# Patient Record
Sex: Male | Born: 1960 | Race: Black or African American | Hispanic: No | Marital: Married | Smoking: Never smoker
Health system: Southern US, Community
[De-identification: ages and names within clinical notes are randomized; demographics above are authoritative.]

## PROBLEM LIST (undated history)

## (undated) DIAGNOSIS — M1990 Unspecified osteoarthritis, unspecified site: Secondary | ICD-10-CM

## (undated) DIAGNOSIS — I10 Essential (primary) hypertension: Secondary | ICD-10-CM

---

## 1997-12-04 ENCOUNTER — Encounter: Payer: Self-pay | Admitting: Pulmonary Disease

## 1997-12-04 ENCOUNTER — Ambulatory Visit (HOSPITAL_COMMUNITY): Admission: RE | Admit: 1997-12-04 | Discharge: 1997-12-04 | Payer: Self-pay | Admitting: Pulmonary Disease

## 1999-03-31 ENCOUNTER — Emergency Department (HOSPITAL_COMMUNITY): Admission: EM | Admit: 1999-03-31 | Discharge: 1999-03-31 | Payer: Self-pay | Admitting: Emergency Medicine

## 2001-01-14 ENCOUNTER — Emergency Department (HOSPITAL_COMMUNITY): Admission: EM | Admit: 2001-01-14 | Discharge: 2001-01-14 | Payer: Self-pay | Admitting: Emergency Medicine

## 2016-05-17 DIAGNOSIS — G039 Meningitis, unspecified: Secondary | ICD-10-CM

## 2016-05-17 DIAGNOSIS — N289 Disorder of kidney and ureter, unspecified: Secondary | ICD-10-CM | POA: Diagnosis not present

## 2016-05-17 DIAGNOSIS — I10 Essential (primary) hypertension: Secondary | ICD-10-CM

## 2016-05-17 DIAGNOSIS — M170 Bilateral primary osteoarthritis of knee: Secondary | ICD-10-CM | POA: Diagnosis not present

## 2016-05-18 DIAGNOSIS — M170 Bilateral primary osteoarthritis of knee: Secondary | ICD-10-CM | POA: Diagnosis not present

## 2016-05-18 DIAGNOSIS — N289 Disorder of kidney and ureter, unspecified: Secondary | ICD-10-CM

## 2016-05-18 DIAGNOSIS — I10 Essential (primary) hypertension: Secondary | ICD-10-CM | POA: Diagnosis not present

## 2016-05-18 DIAGNOSIS — G039 Meningitis, unspecified: Secondary | ICD-10-CM | POA: Diagnosis not present

## 2016-05-19 DIAGNOSIS — M170 Bilateral primary osteoarthritis of knee: Secondary | ICD-10-CM | POA: Diagnosis not present

## 2016-05-19 DIAGNOSIS — I10 Essential (primary) hypertension: Secondary | ICD-10-CM | POA: Diagnosis not present

## 2016-05-19 DIAGNOSIS — N289 Disorder of kidney and ureter, unspecified: Secondary | ICD-10-CM | POA: Diagnosis not present

## 2016-05-19 DIAGNOSIS — G039 Meningitis, unspecified: Secondary | ICD-10-CM | POA: Diagnosis not present

## 2017-08-04 ENCOUNTER — Ambulatory Visit: Payer: BLUE CROSS/BLUE SHIELD | Admitting: Podiatry

## 2017-08-04 ENCOUNTER — Encounter

## 2017-08-04 ENCOUNTER — Ambulatory Visit (INDEPENDENT_AMBULATORY_CARE_PROVIDER_SITE_OTHER): Payer: BLUE CROSS/BLUE SHIELD

## 2017-08-04 VITALS — BP 161/110 | HR 65

## 2017-08-04 DIAGNOSIS — M722 Plantar fascial fibromatosis: Secondary | ICD-10-CM

## 2017-08-04 DIAGNOSIS — M216X9 Other acquired deformities of unspecified foot: Secondary | ICD-10-CM

## 2017-08-04 DIAGNOSIS — M659 Synovitis and tenosynovitis, unspecified: Secondary | ICD-10-CM | POA: Diagnosis not present

## 2017-08-04 DIAGNOSIS — M79671 Pain in right foot: Secondary | ICD-10-CM

## 2017-08-04 DIAGNOSIS — M79672 Pain in left foot: Secondary | ICD-10-CM

## 2017-08-04 MED ORDER — MELOXICAM 15 MG PO TABS: 15.0000 mg | ORAL_TABLET | Freq: Every day | ORAL | 0 refills | Status: DC

## 2017-08-04 NOTE — Progress Notes (Signed)
  Subjective:  Patient ID: Nicholas Gregory, male    DOB: 05/30/1960,  MRN: [id number]  Chief Complaint  Patient presents with  . Foot Pain     history of PF -last injection right x 3 months ago. Knee replacement on right needs revision, also needs total knee on left. Right foot pain is worse than l;eft -worst first thing in the morning. Has tried ice, injections, inserts, the whole nine yards    57 y.o. male presents with the above complaint.  Reports pain to both heels greater on the right.  Was seen in [place] for this issue.  Has received multiple injections.  Never received in the past.  Reports continued pain worse in the a.m.  No past medical history on file.   Current Outpatient Medications:  .  meloxicam (MOBIC) 15 MG tablet, Take 1 tablet (15 mg total) by mouth daily., Disp: 30 tablet, Rfl: 0  Allergies no known allergies Review of Systems: Negative except as noted in the HPI. Denies N/V/F/Ch. Objective:   Vitals:   08/04/17 0923  BP: (!) 161/110  Pulse: 65   General AA&O x3. Normal mood and affect.  Vascular Dorsalis pedis and posterior tibial pulses  present 2+ bilaterally  Capillary refill normal to all digits. Pedal hair growth normal.  Neurologic Epicritic sensation grossly present bilaterally.  Dermatologic No open lesions. Interspaces clear of maceration. Nails well groomed and normal in appearance.  Orthopedic: MMT 5/5 in dorsiflexion, plantarflexion, inversion, and eversion bilaterally. Tender to palpation at the calcaneal tuber bilaterally. No pain with calcaneal squeeze bilaterally. Ankle ROM diminished range of motion right. Silfverskiold Test: positive right.   Radiographs: Taken and reviewed. No acute fractures. No evidence of calcaneal stress fracture.   Assessment & Plan:  Patient was evaluated and treated and all questions answered.  Plantar Fasciitis, right - XR reviewed as above.  - Educated on icing and stretching. Instructions  given.  - Injection delivered to the plantar fascia as below. - Night splint dispensed.  Procedure: Injection Tendon/Ligament Location: Right plantar fascia at the glabrous junction; medial approach. Skin Prep: Alcohol. Injectate: 1 cc 0.5% marcaine plain, 1 cc dexamethasone phosphate, 0.5 cc kenalog 10. Disposition: Patient tolerated procedure well. Injection site dressed with a band-aid.  Return in about 3 weeks (around 08/25/2017) for Plantar fasciitis R.

## 2017-10-19 ENCOUNTER — Other Ambulatory Visit: Payer: Self-pay | Admitting: Podiatry

## 2017-10-19 DIAGNOSIS — M722 Plantar fascial fibromatosis: Secondary | ICD-10-CM

## 2018-05-12 ENCOUNTER — Ambulatory Visit: Payer: BLUE CROSS/BLUE SHIELD | Admitting: Podiatry

## 2018-05-19 ENCOUNTER — Ambulatory Visit: Payer: BLUE CROSS/BLUE SHIELD | Admitting: Podiatry

## 2019-08-13 ENCOUNTER — Ambulatory Visit (INDEPENDENT_AMBULATORY_CARE_PROVIDER_SITE_OTHER): Payer: PRIVATE HEALTH INSURANCE

## 2019-08-13 ENCOUNTER — Ambulatory Visit: Payer: Self-pay

## 2019-08-13 ENCOUNTER — Other Ambulatory Visit: Payer: Self-pay

## 2019-08-13 ENCOUNTER — Encounter: Payer: Self-pay | Admitting: Orthopedic Surgery

## 2019-08-13 ENCOUNTER — Ambulatory Visit: Payer: PRIVATE HEALTH INSURANCE | Admitting: Orthopedic Surgery

## 2019-08-13 DIAGNOSIS — M25561 Pain in right knee: Secondary | ICD-10-CM | POA: Diagnosis not present

## 2019-08-13 DIAGNOSIS — M25562 Pain in left knee: Secondary | ICD-10-CM

## 2019-08-13 DIAGNOSIS — G8929 Other chronic pain: Secondary | ICD-10-CM

## 2019-08-13 DIAGNOSIS — M545 Low back pain, unspecified: Secondary | ICD-10-CM

## 2019-08-13 MED ORDER — PREDNISONE 10 MG PO TABS
10.0000 mg | ORAL_TABLET | Freq: Every day | ORAL | 0 refills | Status: DC
Start: 2019-08-13 — End: 2019-09-12

## 2019-08-13 NOTE — Progress Notes (Signed)
Office Visit Note   Patient: Nicholas Gregory           Date of Birth: [birth year]           MRN: [id number] Visit Date: 08/13/2019              Requested by: Harlow Mares, MD [address] [place],  [place] [address] PCP: Harlow Mares, MD  Chief Complaint  Patient presents with  . Right Knee - Pain  . Left Knee - Pain  . Lower Back - Pain      HPI: Patient is a 59 year old gentleman who presents for second opinion regarding both knees as well as lower back pain.  Patient also reports that a brother had back pain and was subsequently diagnosed with bone cancer.  He states he has dual residency in both [place] and in [place].  He states that his initial right total knee arthroplasty was performed in [place] in 2016 and underwent revision he states he is being recommended that he revise the current revision.  Patient complains of pain fell arthritis of the left knee which prevents him from performing activities of daily living.  Patient also complains of chronic lower back pain with no radicular symptoms.  Patient states that he has had a colonoscopy about 2 years ago and is unsure if he has had his PSA drawn.  Assessment & Plan: Visit Diagnoses:  1. Chronic pain of both knees   2. Low back pain, unspecified back pain laterality, unspecified chronicity, unspecified whether sciatica present     Plan: At follow-up with his primary care physician patient will have his PSA checked.  Prescription called in for prednisone 20 mg with breakfast to help with his back pain.  I have recommended observation and strengthening for the right knee and we will set him up for outpatient physical therapy to help improve the quad strength.  He has significant atrophy.  Recommend proceeding with a total knee arthroplasty on the left and patient states he would like to proceed as soon as possible.  Orders written for prednisone therapy and we will call him to set  up surgery.  Follow-Up Instructions: Return in about 2 weeks (around 08/27/2019).   Ortho Exam  Patient is alert, oriented, no adenopathy, well-dressed, normal affect, normal respiratory effort. Examination patient does have an antalgic gait.  There is no effusion of the right knee.  He has significant quad atrophy.  The knee is straight varus and valgus stress is stable the patella tracks midline.  Examination of the left knee there is crepitation with range of motion there is pain to palpation the patellofemoral joint as well as medial lateral joint lines.  Collaterals and cruciates are stable.  Examination of the back he has a negative straight leg raise bilaterally with no focal motor weakness in either lower extremity.  Imaging: XR KNEE 3 VIEW LEFT  Result Date: 08/13/2019 2 view radiographs of the left knee shows advanced arthritic changes with varus alignment bony spurs in all 3 compartments with subcondylar sclerosis  XR KNEE 3 VIEW RIGHT  Result Date: 08/13/2019 2 view radiographs of the right knee shows a stable revision total knee arthroplasty no lucency around the hardware.  The AP view shows a old stress reaction through the medial femoral condyle which has healed there is also a stress reaction where the stem is abutting the medial aspect of the distal femur.  The bone has remodeled around the stem  no stress fractures no malalignment.  XR Lumbar Spine 2-3 Views  Result Date: 08/13/2019 2 view radiographs of the lumbar spine shows degenerative disc disease with anterior osteophytic bone spurs most proximal in the lumbar spine.  There is joint space narrowing no pars defect no destructive bony changes.  No images are attached to the encounter.  Labs: No results found for: HGBA1C, ESRSEDRATE, CRP, LABURIC, REPTSTATUS, GRAMSTAIN, CULT, LABORGA   No results found for: ALBUMIN, PREALBUMIN, LABURIC  No results found for: MG No results found for: VD25OH  No results found for:  PREALBUMIN No flowsheet data found.   There is no height or weight on file to calculate BMI.  Orders:  Orders Placed This Encounter  Procedures  . XR KNEE 3 VIEW LEFT  . XR KNEE 3 VIEW RIGHT  . XR Lumbar Spine 2-3 Views  . Ambulatory referral to Physical Therapy   Meds ordered this encounter  Medications  . predniSONE (DELTASONE) 10 MG tablet    Sig: Take 1 tablet (10 mg total) by mouth daily with breakfast.    Dispense:  30 tablet    Refill:  0     Procedures: No procedures performed  Clinical Data: No additional findings.  ROS:  All other systems negative, except as noted in the HPI. Review of Systems  Objective: Vital Signs: There were no vitals taken for this visit.  Specialty Comments:  No specialty comments available.  PMFS History: There are no problems to display for this patient.  History reviewed. No pertinent past medical history.  History reviewed. No pertinent family history.  History reviewed. No pertinent surgical history. Social History   Occupational History  . Not on file  Tobacco Use  . Smoking status: Not on file  Substance and Sexual Activity  . Alcohol use: Not on file  . Drug use: Not on file  . Sexual activity: Not on file

## 2019-09-12 ENCOUNTER — Other Ambulatory Visit: Payer: Self-pay | Admitting: Orthopedic Surgery

## 2019-10-06 ENCOUNTER — Other Ambulatory Visit: Payer: Self-pay | Admitting: Orthopedic Surgery

## 2019-10-09 NOTE — Telephone Encounter (Signed)
Please advise, if you would like to refill? Thank you.

## 2019-10-24 ENCOUNTER — Other Ambulatory Visit: Payer: Self-pay | Admitting: Physician Assistant

## 2019-11-07 ENCOUNTER — Other Ambulatory Visit: Payer: Self-pay | Admitting: Physician Assistant

## 2019-12-21 ENCOUNTER — Encounter (HOSPITAL_COMMUNITY): Admission: RE | Payer: Self-pay | Source: Home / Self Care

## 2019-12-21 ENCOUNTER — Inpatient Hospital Stay (HOSPITAL_COMMUNITY)
Admission: RE | Admit: 2019-12-21 | Payer: PRIVATE HEALTH INSURANCE | Source: Home / Self Care | Admitting: Orthopedic Surgery

## 2019-12-21 SURGERY — ARTHROPLASTY, KNEE, TOTAL
Anesthesia: Choice | Site: Knee | Laterality: Left

## 2020-02-21 ENCOUNTER — Other Ambulatory Visit: Payer: Self-pay

## 2020-03-07 ENCOUNTER — Other Ambulatory Visit: Payer: Self-pay | Admitting: Physician Assistant

## 2020-03-13 ENCOUNTER — Other Ambulatory Visit: Payer: Self-pay

## 2020-03-13 ENCOUNTER — Encounter (HOSPITAL_COMMUNITY): Payer: Self-pay

## 2020-03-13 ENCOUNTER — Encounter (HOSPITAL_COMMUNITY)
Admission: RE | Admit: 2020-03-13 | Discharge: 2020-03-13 | Disposition: A | Discharge status: Home / Self Care | Payer: BC Managed Care – PPO | Source: Ambulatory Visit | Attending: Orthopedic Surgery | Admitting: Orthopedic Surgery

## 2020-03-13 DIAGNOSIS — I10 Essential (primary) hypertension: Secondary | ICD-10-CM | POA: Diagnosis not present

## 2020-03-13 DIAGNOSIS — Z01818 Encounter for other preprocedural examination: Secondary | ICD-10-CM | POA: Diagnosis not present

## 2020-03-13 HISTORY — DX: Essential (primary) hypertension: I10

## 2020-03-13 HISTORY — DX: Unspecified osteoarthritis, unspecified site: M19.90

## 2020-03-13 LAB — BASIC METABOLIC PANEL
Anion gap: 9 (ref 5–15)
BUN: 11 mg/dL (ref 6–20)
CO2: 25 mmol/L (ref 22–32)
Calcium: 9.3 mg/dL (ref 8.9–10.3)
Chloride: 105 mmol/L (ref 98–111)
Creatinine, Ser: 1.3 mg/dL — ABNORMAL HIGH (ref 0.61–1.24)
GFR, Estimated: >60 mL/min (ref >60)
Glucose, Bld: 79 mg/dL (ref 70–99)
Potassium: 4 mmol/L (ref 3.5–5.1)
Sodium: 139 mmol/L (ref 135–145)

## 2020-03-13 LAB — CBC
HCT: 45.8 % (ref 39.0–52.0)
Hemoglobin: 15.3 g/dL (ref 13.0–17.0)
MCH: 28 pg (ref 26.0–34.0)
MCHC: 33.4 g/dL (ref 30.0–36.0)
MCV: 83.7 fL (ref 80.0–100.0)
Platelets: 222 10*3/uL (ref 150–400)
RBC: 5.47 MIL/uL (ref 4.22–5.81)
RDW: 13.3 % (ref 11.5–15.5)
WBC: 6.9 10*3/uL (ref 4.0–10.5)
nRBC: 0 % (ref 0.0–0.2)

## 2020-03-13 LAB — SURGICAL PCR SCREEN
MRSA, PCR: NEGATIVE
Staphylococcus aureus: NEGATIVE

## 2020-03-13 NOTE — Progress Notes (Addendum)
[id number] [place], [place] - [address] AT [facility] [address] [place] [address] Phone: [phone] Fax: [phone]      Your procedure is scheduled on Wednesday, December 15th.  Report to [facility] Main Entrance "A" at 6:30 A.M., and check in at the Admitting office.  Call this number if you have problems the morning of surgery:  [phone]  Call [phone] if you have any questions prior to your surgery date Monday-Friday 8am-4pm    Remember:  Do not eat after midnight the night before your surgery  You may drink clear liquids until 5:30 AM the morning of your surgery.   Clear liquids allowed are: Water, Non-Citrus Juices (without pulp), Carbonated Beverages, Clear Tea, Black Coffee Only, and Gatorade  Please finish your pre-surgery Ensure by 5:30 AM, the morning of surgery.  Do not sip.  Drink all in one sitting.  Nothing else to drink after you finish the Ensure.    Take these medicines the morning of surgery with A SIP OF WATER   Amlodipine (Norvasc)  As of today, STOP taking any Aspirin (unless otherwise instructed by your surgeon) Aleve, Naproxen, Ibuprofen, Motrin, Advil, Goody's, BC's, all herbal medications, fish oil, and all vitamins.                      Do not wear jewelry            Do not wear lotions, powders, colognes, or deodorant.            Men may shave face and neck.            Do not bring valuables to the hospital.            [facility] is not responsible for any belongings or valuables.  Do NOT Smoke (Tobacco/Vaping) or drink Alcohol 24 hours prior to your procedure If you use a CPAP at night, you may bring all equipment for your overnight stay.   Contacts, glasses, dentures or bridgework may not be worn into surgery.      For patients admitted to the hospital, discharge time will be determined by your treatment team.   Patients discharged the day of surgery will not be allowed to drive home, and  someone needs to stay with them for 24 hours.    Special instructions:   - Preparing For Surgery  Before surgery, you can play an important role. Because skin is not sterile, your skin needs to be as free of germs as possible. You can reduce the number of germs on your skin by washing with CHG (chlorahexidine gluconate) Soap before surgery.  CHG is an antiseptic cleaner which kills germs and bonds with the skin to continue killing germs even after washing.    Oral Hygiene is also important to reduce your risk of infection.  Remember - BRUSH YOUR TEETH THE MORNING OF SURGERY WITH YOUR REGULAR TOOTHPASTE  Please do not use if you have an allergy to CHG or antibacterial soaps. If your skin becomes reddened/irritated stop using the CHG.  Do not shave (including legs and underarms) for at least 48 hours prior to first CHG shower. It is OK to shave your face.  Please follow these instructions carefully.   1. Shower the NIGHT BEFORE SURGERY and the MORNING OF SURGERY with CHG Soap.   2. If you chose to wash your hair, wash your hair first as usual with your normal shampoo.  3. After you shampoo, rinse your hair and body thoroughly to remove the shampoo.  4. Use CHG as you would any other liquid soap. You can apply CHG directly to the skin and wash gently with a scrungie or a clean washcloth.   5. Apply the CHG Soap to your body ONLY FROM THE NECK DOWN.  Do not use on open wounds or open sores. Avoid contact with your eyes, ears, mouth and genitals (private parts). Wash Face and genitals (private parts)  with your normal soap.   6. Wash thoroughly, paying special attention to the area where your surgery will be performed.  7. Thoroughly rinse your body with warm water from the neck down.  8. DO NOT shower/wash with your normal soap after using and rinsing off the CHG Soap.  9. Pat yourself dry with a CLEAN TOWEL.  10. Wear CLEAN PAJAMAS to bed the night before  surgery  11. Place CLEAN SHEETS on your bed the night of your first shower and DO NOT SLEEP WITH PETS.   Day of Surgery: Wear Clean/Comfortable clothing the morning of surgery Do not apply any deodorants/lotions.   Remember to brush your teeth WITH YOUR REGULAR TOOTHPASTE.   Please read over the following fact sheets that you were given.

## 2020-03-13 NOTE — Progress Notes (Signed)
PCP - Linden Dolin Cardiologist - denies  Chest x-ray - n/a EKG - 03-13-20   ERAS Protcol - yes, Ensure given   COVID TEST- 03-17-20   Anesthesia review: n/a  Patient denies shortness of breath, fever, cough and chest pain at PAT appointment   All instructions explained to the patient, with a verbal understanding of the material. Patient agrees to go over the instructions while at home for a better understanding. Patient also instructed to self quarantine after being tested for COVID-19. The opportunity to ask questions was provided.   Patient stated he had questions for Dr. Lajoyce Corners.  He stated he tried multiple times to contact Dr. Audrie Lia office, left multiple voicemails, with no return call back.  Called Consuello Bossier at PAT appointment, left message, to have someone from his office call Nicholas Gregory.

## 2020-03-17 ENCOUNTER — Encounter: Payer: Self-pay | Admitting: Orthopedic Surgery

## 2020-03-17 ENCOUNTER — Other Ambulatory Visit (HOSPITAL_COMMUNITY)
Admission: RE | Admit: 2020-03-17 | Discharge: 2020-03-17 | Disposition: A | Discharge status: Home / Self Care | Payer: BC Managed Care – PPO | Source: Ambulatory Visit | Attending: Orthopedic Surgery | Admitting: Orthopedic Surgery

## 2020-03-17 ENCOUNTER — Ambulatory Visit (INDEPENDENT_AMBULATORY_CARE_PROVIDER_SITE_OTHER): Payer: BC Managed Care – PPO | Admitting: Orthopedic Surgery

## 2020-03-17 VITALS — Ht 70.0 in | Wt 241.0 lb

## 2020-03-17 DIAGNOSIS — M25562 Pain in left knee: Secondary | ICD-10-CM | POA: Diagnosis not present

## 2020-03-17 DIAGNOSIS — Z20822 Contact with and (suspected) exposure to covid-19: Secondary | ICD-10-CM | POA: Diagnosis not present

## 2020-03-17 DIAGNOSIS — M1712 Unilateral primary osteoarthritis, left knee: Secondary | ICD-10-CM | POA: Diagnosis not present

## 2020-03-17 DIAGNOSIS — M25561 Pain in right knee: Secondary | ICD-10-CM

## 2020-03-17 DIAGNOSIS — Z01812 Encounter for preprocedural laboratory examination: Secondary | ICD-10-CM | POA: Diagnosis present

## 2020-03-17 DIAGNOSIS — G8929 Other chronic pain: Secondary | ICD-10-CM

## 2020-03-18 ENCOUNTER — Encounter: Payer: Self-pay | Admitting: Orthopedic Surgery

## 2020-03-18 LAB — SARS CORONAVIRUS 2 (TAT 6-24 HRS): SARS Coronavirus 2: NEGATIVE

## 2020-03-18 NOTE — Progress Notes (Signed)
Office Visit Note   Patient: Nicholas Gregory           Date of Birth: January 09, 1961           MRN: [id number] Visit Date: 03/17/2020              Requested by: Harlow Mares, MD [address] [place],  [place] [address] PCP: Harlow Mares, MD  Chief Complaint  Patient presents with  . Left Knee - Follow-up    Sch for a total knee replacement 03/19/20      HPI: Patient is a 59 year old gentleman who is seen for evaluation for osteoarthritis of his left knee.  He is status post total knee arthroplasty on the right as well as revision surgery.  Patient states he has pain in the right total knee and is concerned about outcome for his left knee.  Assessment & Plan: Visit Diagnoses:  1. Chronic pain of both knees   2. Unilateral primary osteoarthritis, left knee     Plan: Reviewed the importance of postoperative protocol for strengthening patient was given 3 exercises to do at this time to help with the right knee discussed the importance of doing this as a long-term treatment for both total knees.  Risks and benefits of surgery were discussed for total knee arthroplasty on the left patient states he understands wished to proceed with surgery.  Follow-Up Instructions: Return in about 2 weeks (around 03/31/2020).   Ortho Exam  Patient is alert, oriented, no adenopathy, well-dressed, normal affect, normal respiratory effort. Examination radiographs of the right knee shows the femoral stem abutting the cortex of the medial femur.  There has been bony remodeling around this area.  The total knee is stable but patient does have laxity with passive range of motion of the right knee.  Left knee shows bone-on-bone contact the medial joint line with periarticular bony spurs in all 3 compartments.  There is varus alignment.  Imaging: No results found. No images are attached to the encounter.  Labs: No results found for: HGBA1C, ESRSEDRATE, CRP, LABURIC, REPTSTATUS,  GRAMSTAIN, CULT, LABORGA   No results found for: ALBUMIN, PREALBUMIN, LABURIC  No results found for: MG No results found for: VD25OH  No results found for: PREALBUMIN CBC EXTENDED Latest Ref Rng & Units 03/13/2020  WBC 4.0 - 10.5 K/uL 6.9  RBC 4.22 - 5.81 MIL/uL 5.47  HGB 13.0 - 17.0 g/dL 57.8  HCT 46.9 - 62.9 % 45.8  PLT 150 - 400 K/uL 222     Body mass index is 34.58 kg/m.  Orders:  No orders of the defined types were placed in this encounter.  No orders of the defined types were placed in this encounter.    Procedures: No procedures performed  Clinical Data: No additional findings.  ROS:  All other systems negative, except as noted in the HPI. Review of Systems  Objective: Vital Signs: Ht 5\' 10"  (1.778 m)   Wt 241 lb (109.3 kg)   BMI 34.58 kg/m   Specialty Comments:  No specialty comments available.  PMFS History: There are no problems to display for this patient.  Past Medical History:  Diagnosis Date  . Arthritis   . Hypertension     History reviewed. No pertinent family history.  Past Surgical History:  Procedure Laterality Date  . KNEE ARTHROPLASTY Right    x2  . KNEE ARTHROSCOPY Left    Social History   Occupational History  . Not on file  Tobacco  Use  . Smoking status: Never Smoker  . Smokeless tobacco: Never Used  Vaping Use  . Vaping Use: Never used  Substance and Sexual Activity  . Alcohol use: Never  . Drug use: Never  . Sexual activity: Not on file

## 2020-03-18 NOTE — Anesthesia Preprocedure Evaluation (Addendum)
Anesthesia Evaluation  Patient identified by MRN, date of birth, ID band Patient awake    Reviewed: Allergy & Precautions, H&P , NPO status , Patient's Chart, lab work & pertinent test results  Airway Mallampati: II  TM Distance: >3 FB Neck ROM: Full    Dental no notable dental hx. (+) Teeth Intact, Dental Advisory Given   Pulmonary neg pulmonary ROS,    Pulmonary exam normal breath sounds clear to auscultation       Cardiovascular Exercise Tolerance: Good hypertension, Pt. on medications  Rhythm:Regular Rate:Normal     Neuro/Psych negative neurological ROS  negative psych ROS   GI/Hepatic negative GI ROS, Neg liver ROS,   Endo/Other  negative endocrine ROS  Renal/GU negative Renal ROS  negative genitourinary   Musculoskeletal  (+) Arthritis , Osteoarthritis,    Abdominal   Peds  Hematology negative hematology ROS (+)   Anesthesia Other Findings   Reproductive/Obstetrics negative OB ROS                            Anesthesia Physical Anesthesia Plan  ASA: II  Anesthesia Plan: Spinal and MAC   Post-op Pain Management:  Regional for Post-op pain   Induction: Intravenous  PONV Risk Score and Plan: 2 and Ondansetron, Dexamethasone, Propofol infusion and Midazolam  Airway Management Planned: Simple Face Mask  Additional Equipment:   Intra-op Plan:   Post-operative Plan:   Informed Consent: I have reviewed the patients History and Physical, chart, labs and discussed the procedure including the risks, benefits and alternatives for the proposed anesthesia with the patient or authorized representative who has indicated his/her understanding and acceptance.     Dental advisory given  Plan Discussed with: CRNA  Anesthesia Plan Comments:        Anesthesia Quick Evaluation

## 2020-03-19 ENCOUNTER — Encounter (HOSPITAL_COMMUNITY)
Admission: RE | Disposition: A | Discharge status: Home / Self Care | Payer: Self-pay | Source: Home / Self Care | Attending: Orthopedic Surgery

## 2020-03-19 ENCOUNTER — Other Ambulatory Visit: Payer: Self-pay

## 2020-03-19 ENCOUNTER — Ambulatory Visit (HOSPITAL_COMMUNITY): Payer: BC Managed Care – PPO | Admitting: Anesthesiology

## 2020-03-19 ENCOUNTER — Observation Stay (HOSPITAL_COMMUNITY)
Admission: RE | Admit: 2020-03-19 | Discharge: 2020-03-21 | Disposition: A | Discharge status: Home / Self Care | Payer: BC Managed Care – PPO | Attending: Orthopedic Surgery | Admitting: Orthopedic Surgery

## 2020-03-19 ENCOUNTER — Encounter (HOSPITAL_COMMUNITY): Payer: Self-pay | Admitting: Orthopedic Surgery

## 2020-03-19 DIAGNOSIS — M1712 Unilateral primary osteoarthritis, left knee: Principal | ICD-10-CM

## 2020-03-19 DIAGNOSIS — Z23 Encounter for immunization: Secondary | ICD-10-CM | POA: Diagnosis not present

## 2020-03-19 DIAGNOSIS — I10 Essential (primary) hypertension: Secondary | ICD-10-CM | POA: Insufficient documentation

## 2020-03-19 DIAGNOSIS — Z79899 Other long term (current) drug therapy: Secondary | ICD-10-CM | POA: Insufficient documentation

## 2020-03-19 HISTORY — PX: TOTAL KNEE ARTHROPLASTY: SHX125

## 2020-03-19 SURGERY — ARTHROPLASTY, KNEE, TOTAL
Anesthesia: Monitor Anesthesia Care | Site: Knee | Laterality: Left

## 2020-03-19 MED ORDER — KETOROLAC TROMETHAMINE 15 MG/ML IJ SOLN
INTRAMUSCULAR | Status: AC
  Administered 2020-03-19: 12:00:00 15 mg via INTRAVENOUS
  Filled 2020-03-19: qty 1

## 2020-03-19 MED ORDER — CEFAZOLIN SODIUM-DEXTROSE 2-4 GM/100ML-% IV SOLN
INTRAVENOUS | Status: AC
  Filled 2020-03-19: qty 100

## 2020-03-19 MED ORDER — HYDROMORPHONE HCL 1 MG/ML IJ SOLN
0.2500 mg | INTRAMUSCULAR | Status: DC | PRN
  Administered 2020-03-19 (×3): 0.25 mg via INTRAVENOUS

## 2020-03-19 MED ORDER — AMLODIPINE BESYLATE 10 MG PO TABS
10.0000 mg | ORAL_TABLET | Freq: Every day | ORAL | Status: DC
  Administered 2020-03-19 – 2020-03-21 (×3): 10 mg via ORAL
  Filled 2020-03-19 (×3): qty 1

## 2020-03-19 MED ORDER — METHOCARBAMOL 500 MG PO TABS
ORAL_TABLET | ORAL | Status: AC
  Administered 2020-03-19: 12:00:00 500 mg via ORAL
  Filled 2020-03-19: qty 1

## 2020-03-19 MED ORDER — ACETAMINOPHEN 325 MG PO TABS
325.0000 mg | ORAL_TABLET | Freq: Four times a day (QID) | ORAL | Status: DC | PRN
  Administered 2020-03-20 – 2020-03-21 (×2): 650 mg via ORAL
  Filled 2020-03-19 (×2): qty 2

## 2020-03-19 MED ORDER — BUPIVACAINE IN DEXTROSE 0.75-8.25 % IT SOLN
INTRATHECAL | Status: DC | PRN
Start: 2020-03-19 — End: 2020-03-19
  Administered 2020-03-19: 1.8 mL via INTRATHECAL

## 2020-03-19 MED ORDER — MENTHOL 3 MG MT LOZG: 1.0000 | LOZENGE | OROMUCOSAL | Status: DC | PRN

## 2020-03-19 MED ORDER — ACETAMINOPHEN 500 MG PO TABS: 1000.0000 mg | ORAL_TABLET | Freq: Once | ORAL | Status: AC

## 2020-03-19 MED ORDER — ORAL CARE MOUTH RINSE: 15.0000 mL | Freq: Once | OROMUCOSAL | Status: AC

## 2020-03-19 MED ORDER — PROPOFOL 10 MG/ML IV BOLUS
INTRAVENOUS | Status: AC
  Filled 2020-03-19: qty 20

## 2020-03-19 MED ORDER — ASPIRIN EC 325 MG PO TBEC
325.0000 mg | DELAYED_RELEASE_TABLET | Freq: Every day | ORAL | Status: DC
  Administered 2020-03-20 – 2020-03-21 (×2): 325 mg via ORAL
  Filled 2020-03-19 (×2): qty 1

## 2020-03-19 MED ORDER — METOCLOPRAMIDE HCL 5 MG/ML IJ SOLN: 5.0000 mg | Freq: Three times a day (TID) | INTRAMUSCULAR | Status: DC | PRN

## 2020-03-19 MED ORDER — MIDAZOLAM HCL 5 MG/5ML IJ SOLN
INTRAMUSCULAR | Status: DC | PRN
  Administered 2020-03-19: 2 mg via INTRAVENOUS

## 2020-03-19 MED ORDER — SODIUM CHLORIDE 0.9 % IV SOLN: INTRAVENOUS | Status: DC

## 2020-03-19 MED ORDER — FENTANYL CITRATE (PF) 100 MCG/2ML IJ SOLN
INTRAMUSCULAR | Status: DC | PRN
  Administered 2020-03-19: 50 ug via INTRAVENOUS

## 2020-03-19 MED ORDER — PROPOFOL 1000 MG/100ML IV EMUL
INTRAVENOUS | Status: AC
  Filled 2020-03-19: qty 100

## 2020-03-19 MED ORDER — CHLORHEXIDINE GLUCONATE 0.12 % MT SOLN: 15.0000 mL | Freq: Once | OROMUCOSAL | Status: AC

## 2020-03-19 MED ORDER — LACTATED RINGERS IV SOLN: INTRAVENOUS | Status: DC

## 2020-03-19 MED ORDER — METOCLOPRAMIDE HCL 5 MG PO TABS: 5.0000 mg | ORAL_TABLET | Freq: Three times a day (TID) | ORAL | Status: DC | PRN

## 2020-03-19 MED ORDER — CHLORHEXIDINE GLUCONATE 0.12 % MT SOLN
OROMUCOSAL | Status: AC
  Administered 2020-03-19: 07:00:00 15 mL via OROMUCOSAL
  Filled 2020-03-19: qty 15

## 2020-03-19 MED ORDER — MIDAZOLAM HCL 2 MG/2ML IJ SOLN
INTRAMUSCULAR | Status: AC
  Filled 2020-03-19: qty 2

## 2020-03-19 MED ORDER — FENTANYL CITRATE (PF) 250 MCG/5ML IJ SOLN
INTRAMUSCULAR | Status: AC
  Filled 2020-03-19: qty 5

## 2020-03-19 MED ORDER — SODIUM CHLORIDE 0.9 % IR SOLN
Status: DC | PRN
  Administered 2020-03-19: 3000 mL

## 2020-03-19 MED ORDER — METHOCARBAMOL 500 MG PO TABS
500.0000 mg | ORAL_TABLET | Freq: Four times a day (QID) | ORAL | Status: DC | PRN
  Administered 2020-03-20: 12:00:00 500 mg via ORAL
  Filled 2020-03-19: qty 1

## 2020-03-19 MED ORDER — CEFAZOLIN SODIUM-DEXTROSE 2-4 GM/100ML-% IV SOLN
2.0000 g | INTRAVENOUS | Status: AC
  Administered 2020-03-19: 08:00:00 2 g via INTRAVENOUS

## 2020-03-19 MED ORDER — EPHEDRINE SULFATE-NACL 50-0.9 MG/10ML-% IV SOSY
PREFILLED_SYRINGE | INTRAVENOUS | Status: DC | PRN
  Administered 2020-03-19: 5 mg via INTRAVENOUS

## 2020-03-19 MED ORDER — PROPOFOL 500 MG/50ML IV EMUL
INTRAVENOUS | Status: DC | PRN
  Administered 2020-03-19: 75 ug/kg/min via INTRAVENOUS

## 2020-03-19 MED ORDER — TRANEXAMIC ACID-NACL 1000-0.7 MG/100ML-% IV SOLN
INTRAVENOUS | Status: AC
  Filled 2020-03-19: qty 100

## 2020-03-19 MED ORDER — 0.9 % SODIUM CHLORIDE (POUR BTL) OPTIME
TOPICAL | Status: DC | PRN
  Administered 2020-03-19: 09:00:00 1000 mL

## 2020-03-19 MED ORDER — ONDANSETRON HCL 4 MG PO TABS: 4.0000 mg | ORAL_TABLET | Freq: Four times a day (QID) | ORAL | Status: DC | PRN

## 2020-03-19 MED ORDER — TRANEXAMIC ACID 1000 MG/10ML IV SOLN
2000.0000 mg | INTRAVENOUS | Status: AC
  Administered 2020-03-19: 09:00:00 2000 mg via TOPICAL
  Filled 2020-03-19: qty 20

## 2020-03-19 MED ORDER — KETOROLAC TROMETHAMINE 15 MG/ML IJ SOLN
15.0000 mg | Freq: Four times a day (QID) | INTRAMUSCULAR | Status: AC
  Administered 2020-03-19 – 2020-03-20 (×3): 15 mg via INTRAVENOUS
  Filled 2020-03-19 (×3): qty 1

## 2020-03-19 MED ORDER — METHOCARBAMOL 1000 MG/10ML IJ SOLN
500.0000 mg | Freq: Four times a day (QID) | INTRAVENOUS | Status: DC | PRN
  Filled 2020-03-19: qty 5

## 2020-03-19 MED ORDER — PHENYLEPHRINE 40 MCG/ML (10ML) SYRINGE FOR IV PUSH (FOR BLOOD PRESSURE SUPPORT)
PREFILLED_SYRINGE | INTRAVENOUS | Status: DC | PRN
  Administered 2020-03-19: 80 ug via INTRAVENOUS

## 2020-03-19 MED ORDER — TRANEXAMIC ACID-NACL 1000-0.7 MG/100ML-% IV SOLN
INTRAVENOUS | Status: DC | PRN
  Administered 2020-03-19: 1000 mg via INTRAVENOUS

## 2020-03-19 MED ORDER — OXYCODONE HCL 5 MG PO TABS
5.0000 mg | ORAL_TABLET | ORAL | Status: DC | PRN
  Administered 2020-03-19 – 2020-03-21 (×6): 10 mg via ORAL
  Administered 2020-03-21: 5 mg via ORAL
  Filled 2020-03-19: qty 2
  Filled 2020-03-19: qty 1
  Filled 2020-03-19 (×5): qty 2

## 2020-03-19 MED ORDER — ONDANSETRON HCL 4 MG/2ML IJ SOLN: 4.0000 mg | Freq: Four times a day (QID) | INTRAMUSCULAR | Status: DC | PRN

## 2020-03-19 MED ORDER — PHENOL 1.4 % MT LIQD: 1.0000 | OROMUCOSAL | Status: DC | PRN

## 2020-03-19 MED ORDER — ONDANSETRON HCL 4 MG/2ML IJ SOLN
INTRAMUSCULAR | Status: AC
  Administered 2020-03-19: 12:00:00 4 mg via INTRAVENOUS
  Filled 2020-03-19: qty 2

## 2020-03-19 MED ORDER — DOCUSATE SODIUM 100 MG PO CAPS
100.0000 mg | ORAL_CAPSULE | Freq: Two times a day (BID) | ORAL | Status: DC
  Administered 2020-03-19 – 2020-03-21 (×4): 100 mg via ORAL
  Filled 2020-03-19 (×4): qty 1

## 2020-03-19 MED ORDER — HYDROMORPHONE HCL 1 MG/ML IJ SOLN
0.5000 mg | INTRAMUSCULAR | Status: DC | PRN
  Administered 2020-03-20 (×2): 0.5 mg via INTRAVENOUS
  Filled 2020-03-19 (×2): qty 0.5

## 2020-03-19 MED ORDER — ACETAMINOPHEN 500 MG PO TABS
ORAL_TABLET | ORAL | Status: AC
  Administered 2020-03-19: 07:00:00 1000 mg via ORAL
  Filled 2020-03-19: qty 2

## 2020-03-19 MED ORDER — INFLUENZA VAC SPLIT QUAD 0.5 ML IM SUSY
0.5000 mL | PREFILLED_SYRINGE | INTRAMUSCULAR | Status: AC
  Administered 2020-03-21: 09:00:00 0.5 mL via INTRAMUSCULAR
  Filled 2020-03-19: qty 0.5

## 2020-03-19 MED ORDER — HYDROMORPHONE HCL 1 MG/ML IJ SOLN
INTRAMUSCULAR | Status: AC
  Administered 2020-03-19: 11:00:00 0.25 mg via INTRAVENOUS
  Filled 2020-03-19: qty 1

## 2020-03-19 MED ORDER — CEFAZOLIN SODIUM-DEXTROSE 2-4 GM/100ML-% IV SOLN
2.0000 g | Freq: Four times a day (QID) | INTRAVENOUS | Status: AC
  Administered 2020-03-19 (×2): 2 g via INTRAVENOUS
  Filled 2020-03-19 (×4): qty 100

## 2020-03-19 SURGICAL SUPPLY — 54 items
ATTUNE MED DOME PAT 38 KNEE (Knees) ×2 IMPLANT
ATTUNE MED DOME PAT 38MM KNEE (Knees) ×1 IMPLANT
ATTUNE PS FEM LT SZ 6 CEM KNEE (Femur) ×3 IMPLANT
BASE TIBIAL CEM ATTUNE SZ 7 (Knees) ×3 IMPLANT
BASEPLATE TIB CEM ATTUNE SZ7 (Knees) ×1 IMPLANT
BLADE SAGITTAL 25.0X1.19X90 (BLADE) ×2 IMPLANT
BLADE SAGITTAL 25.0X1.19X90MM (BLADE) ×1
BLADE SAW SGTL 13X75X1.27 (BLADE) ×3 IMPLANT
BLADE SURG 21 STRL SS (BLADE) ×6 IMPLANT
BNDG COHESIVE 6X5 TAN STRL LF (GAUZE/BANDAGES/DRESSINGS) ×3 IMPLANT
BNDG GAUZE ELAST 4 BULKY (GAUZE/BANDAGES/DRESSINGS) ×3 IMPLANT
BOWL SMART MIX CTS (DISPOSABLE) ×3 IMPLANT
CEMENT BONE R 1X40 (Cement) ×9 IMPLANT
COOLER ICEMAN CLASSIC (MISCELLANEOUS) ×3 IMPLANT
COVER SURGICAL LIGHT HANDLE (MISCELLANEOUS) ×6 IMPLANT
CUFF TOURN SGL QUICK 34 (TOURNIQUET CUFF) ×3
CUFF TOURN SGL QUICK 42 (TOURNIQUET CUFF) IMPLANT
CUFF TRNQT CYL 34X4.125X (TOURNIQUET CUFF) ×1 IMPLANT
DRAPE EXTREMITY T 121X128X90 (DISPOSABLE) ×3 IMPLANT
DRAPE HALF SHEET 40X57 (DRAPES) ×6 IMPLANT
DRAPE U-SHAPE 47X51 STRL (DRAPES) ×3 IMPLANT
DRSG ADAPTIC 3X8 NADH LF (GAUZE/BANDAGES/DRESSINGS) ×3 IMPLANT
DRSG PAD ABDOMINAL 8X10 ST (GAUZE/BANDAGES/DRESSINGS) ×3 IMPLANT
DURAPREP 26ML APPLICATOR (WOUND CARE) ×3 IMPLANT
ELECT REM PT RETURN 9FT ADLT (ELECTROSURGICAL) ×3
ELECTRODE REM PT RTRN 9FT ADLT (ELECTROSURGICAL) ×1 IMPLANT
FACESHIELD WRAPAROUND (MASK) ×3 IMPLANT
GAUZE SPONGE 4X4 12PLY STRL (GAUZE/BANDAGES/DRESSINGS) ×3 IMPLANT
GLOVE BIOGEL PI IND STRL 9 (GLOVE) ×1 IMPLANT
GLOVE BIOGEL PI INDICATOR 9 (GLOVE) ×2
GLOVE SURG ORTHO 9.0 STRL STRW (GLOVE) ×3 IMPLANT
GOWN STRL REUS W/ TWL XL LVL3 (GOWN DISPOSABLE) ×2 IMPLANT
GOWN STRL REUS W/TWL XL LVL3 (GOWN DISPOSABLE) ×6
HANDPIECE INTERPULSE COAX TIP (DISPOSABLE) ×3
INSERT TIB PS FB ATTUNE SZ6X5 (Knees) ×3 IMPLANT
KIT BASIN OR (CUSTOM PROCEDURE TRAY) ×3 IMPLANT
KIT TURNOVER KIT B (KITS) ×3 IMPLANT
MANIFOLD NEPTUNE II (INSTRUMENTS) ×3 IMPLANT
NS IRRIG 1000ML POUR BTL (IV SOLUTION) ×3 IMPLANT
PACK TOTAL JOINT (CUSTOM PROCEDURE TRAY) ×3 IMPLANT
PAD ARMBOARD 7.5X6 YLW CONV (MISCELLANEOUS) ×3 IMPLANT
PAD COLD SHLDR WRAP-ON (PAD) ×3 IMPLANT
PIN DRILL FIX HALF THREAD (BIT) ×3 IMPLANT
PIN FIX SIGMA LCS THRD HI (PIN) ×3 IMPLANT
SET HNDPC FAN SPRY TIP SCT (DISPOSABLE) ×1 IMPLANT
STAPLER VISISTAT 35W (STAPLE) ×3 IMPLANT
SUCTION FRAZIER HANDLE 10FR (MISCELLANEOUS) ×3
SUCTION TUBE FRAZIER 10FR DISP (MISCELLANEOUS) ×1 IMPLANT
SUT VIC AB 0 CT1 27 (SUTURE) ×6
SUT VIC AB 0 CT1 27XBRD ANBCTR (SUTURE) ×2 IMPLANT
SUT VIC AB 1 CTX 36 (SUTURE) ×3
SUT VIC AB 1 CTX36XBRD ANBCTR (SUTURE) ×1 IMPLANT
TOWEL GREEN STERILE (TOWEL DISPOSABLE) ×3 IMPLANT
TOWEL GREEN STERILE FF (TOWEL DISPOSABLE) ×3 IMPLANT

## 2020-03-19 NOTE — Anesthesia Postprocedure Evaluation (Signed)
Anesthesia Post Note  Patient: Nicholas Gregory  Procedure(s) Performed: LEFT TOTAL KNEE ARTHROPLASTY (Left Knee)     Patient location during evaluation: PACU Anesthesia Type: MAC, Spinal and Regional Level of consciousness: oriented and awake and alert Pain management: pain level controlled Vital Signs Assessment: post-procedure vital signs reviewed and stable Respiratory status: spontaneous breathing and respiratory function stable Cardiovascular status: blood pressure returned to baseline and stable Postop Assessment: no headache, no backache, no apparent nausea or vomiting, spinal receding and patient able to bend at knees Anesthetic complications: no   No complications documented.  Last Vitals:  Vitals:   03/19/20 1300 03/19/20 1314  BP: 130/83 136/90  Pulse: (!) 55 (!) 56  Resp: 17 16  Temp:    SpO2: 96% 97%    Last Pain:  Vitals:   03/19/20 1314  TempSrc:   PainSc: Asleep                 Makyla Bye,W. EDMOND

## 2020-03-19 NOTE — Evaluation (Signed)
Physical Therapy Evaluation Patient Details Name: Nicholas Gregory MRN: [id number] DOB: Feb 12, 1961 Today's Date: 03/19/2020   History of Present Illness  Pt is a 59 y/o male s/p L TKA. PMH includes HTN.  Clinical Impression  Pt is s/p surgery above with deficits below. Pt requiring min to min guard A for mobility using RW this session. Distance limited secondary to pain. Educated about knee precautions. Will continue to follow acutely.     Follow Up Recommendations Follow surgeon's recommendation for DC plan and follow-up therapies    Equipment Recommendations  Rolling walker with 5" wheels;3in1 (PT)    Recommendations for Other Services       Precautions / Restrictions Precautions Precautions: Knee Precaution Booklet Issued: No Precaution Comments: Verbally reviewed knee precautions. Restrictions Weight Bearing Restrictions: Yes LLE Weight Bearing: Weight bearing as tolerated      Mobility  Bed Mobility Overal bed mobility: Needs Assistance Bed Mobility: Supine to Sit     Supine to sit: Min assist     General bed mobility comments: Min A For LLE management. Increased time to perform as pt reporting increased pain. PT having to hold LLE throughout as pt did not want to let it bend initiallly.    Transfers Overall transfer level: Needs assistance Equipment used: Rolling walker (2 wheeled) Transfers: Sit to/from Stand Sit to Stand: Min assist;From elevated surface         General transfer comment: Min A for lift assist. Cues for hand placement.  Ambulation/Gait Ambulation/Gait assistance: Min guard Gait Distance (Feet): 25 Feet Assistive device: Rolling walker (2 wheeled) Gait Pattern/deviations: Step-to pattern;Decreased step length - right;Decreased step length - left;Decreased weight shift to left;Antalgic Gait velocity: Decreased   General Gait Details: Antalgic gait. Pt walking on L toe secondary to pain. Cues for sequencing using RW.  Stairs             Wheelchair Mobility    Modified Rankin (Stroke Patients Only)       Balance Overall balance assessment: Needs assistance Sitting-balance support: No upper extremity supported Sitting balance-Leahy Scale: Good     Standing balance support: Bilateral upper extremity supported Standing balance-Leahy Scale: Poor Standing balance comment: Reliant on BUE support                             Pertinent Vitals/Pain Pain Assessment: 0-10 Pain Score: 9  Pain Location: L knee Pain Descriptors / Indicators: Aching;Operative site guarding Pain Intervention(s): Limited activity within patient's tolerance;Monitored during session;Repositioned    Home Living Family/patient expects to be discharged to:: Private residence Living Arrangements: Spouse/significant other Available Help at Discharge: Family Type of Home: House Home Access: Ramped entrance     Home Layout: Two level;Able to live on main level with bedroom/bathroom Home Equipment: Grab bars - toilet;Shower seat - built in      Prior Function Level of Independence: Independent               Higher education careers adviser        Extremity/Trunk Assessment   Upper Extremity Assessment Upper Extremity Assessment: Overall WFL for tasks assessed    Lower Extremity Assessment Lower Extremity Assessment: LLE deficits/detail LLE Deficits / Details: deficits consistent with post op pain and weakness.    Cervical / Trunk Assessment Cervical / Trunk Assessment: Normal  Communication   Communication: No difficulties  Cognition Arousal/Alertness: Awake/alert Behavior During Therapy: WFL for tasks assessed/performed Overall Cognitive Status: Within Functional Limits for  tasks assessed                                        General Comments General comments (skin integrity, edema, etc.): Pt's wife present during session    Exercises Total Joint Exercises Ankle Circles/Pumps: AROM;Both;10  reps;Supine   Assessment/Plan    PT Assessment Patient needs continued PT services  PT Problem List Decreased strength;Decreased range of motion;Decreased activity tolerance;Decreased mobility;Decreased balance;Decreased knowledge of use of DME;Decreased knowledge of precautions;Pain       PT Treatment Interventions DME instruction;Gait training;Stair training;Functional mobility training;Therapeutic activities;Therapeutic exercise;Balance training;Patient/family education    PT Goals (Current goals can be found in the Care Plan section)  Acute Rehab PT Goals Patient Stated Goal: to go home PT Goal Formulation: With patient Time For Goal Achievement: 04/02/20 Potential to Achieve Goals: Good    Frequency 7X/week   Barriers to discharge        Co-evaluation               AM-PAC PT "6 Clicks" Mobility  Outcome Measure Help needed turning from your back to your side while in a flat bed without using bedrails?: A Little Help needed moving from lying on your back to sitting on the side of a flat bed without using bedrails?: A Little Help needed moving to and from a bed to a chair (including a wheelchair)?: A Little Help needed standing up from a chair using your arms (e.g., wheelchair or bedside chair)?: A Little Help needed to walk in hospital room?: A Little Help needed climbing 3-5 steps with a railing? : A Lot 6 Click Score: 17    End of Session   Activity Tolerance: Patient limited by pain Patient left: in chair;with call bell/phone within reach Nurse Communication: Mobility status PT Visit Diagnosis: Other abnormalities of gait and mobility (R26.89);Pain Pain - Right/Left: Left Pain - part of body: Knee    Time: 7322-0254 PT Time Calculation (min) (ACUTE ONLY): 16 min   Charges:   PT Evaluation $PT Eval Low Complexity: 1 Low          Cindee Salt, DPT  Acute Rehabilitation Services  Pager: [phone] Office: [phone]   Nicholas Gregory 03/19/2020, 4:01 PM

## 2020-03-19 NOTE — Transfer of Care (Signed)
Immediate Anesthesia Transfer of Care Note  Patient: Nicholas Gregory  Procedure(s) Performed: LEFT TOTAL KNEE ARTHROPLASTY (Left Knee)  Patient Location: PACU  Anesthesia Type:MAC, Regional and Spinal  Level of Consciousness: awake and patient cooperative  Airway & Oxygen Therapy: Patient Spontanous Breathing  Post-op Assessment: Report given to RN and Post -op Vital signs reviewed and stable  Post vital signs: Reviewed and stable  Last Vitals:  Vitals Value Taken Time  BP    Temp    Pulse    Resp    SpO2      Last Pain:  Vitals:   03/19/20 0731  TempSrc: Oral  PainSc:       Patients Stated Pain Goal: 2 (74/08/14 4818)  Complications: No complications documented.

## 2020-03-19 NOTE — Anesthesia Procedure Notes (Signed)
Spinal  Patient location during procedure: OR Start time: 03/19/2020 8:20 AM End time: 03/19/2020 8:25 AM Staffing Performed: anesthesiologist  Anesthesiologist: Gaynelle Adu, MD Preanesthetic Checklist Completed: patient identified, IV checked, risks and benefits discussed, surgical consent, monitors and equipment checked, pre-op evaluation and timeout performed Spinal Block Patient position: sitting Prep: DuraPrep Patient monitoring: cardiac monitor, continuous pulse ox and blood pressure Approach: midline Location: L3-4 Injection technique: single-shot Needle Needle type: Pencan  Needle gauge: 24 G Needle length: 9 cm Assessment Sensory level: T8 Additional Notes Functioning IV was confirmed and monitors were applied. Sterile prep and drape, including hand hygiene and sterile gloves were used. The patient was positioned and the spine was prepped. The skin was anesthetized with lidocaine.  Free flow of clear CSF was obtained prior to injecting local anesthetic into the CSF.  The spinal needle aspirated freely following injection.  The needle was carefully withdrawn.  The patient tolerated the procedure well.

## 2020-03-19 NOTE — H&P (Signed)
TOTAL KNEE ADMISSION H&P  Patient is being admitted for left total knee arthroplasty.  Subjective:  Chief Complaint:left knee pain.  HPI: Nicholas Gregory, 59 y.o. male, has a history of pain and functional disability in the left knee due to arthritis and has failed non-surgical conservative treatments for greater than 12 weeks to includeNSAID's and/or analgesics and corticosteriod injections.  Onset of symptoms was gradual, starting 5 years ago with gradually worsening course since that time. The patient noted no past surgery on the left knee(s).  Patient currently rates pain in the left knee(s) at 5 out of 10 with activity. Patient has worsening of pain with activity and weight bearing and pain that interferes with activities of daily living.  Patient has evidence of subchondral cysts by imaging studies. This patient has had . There is no active infection.  There are no problems to display for this patient.  Past Medical History:  Diagnosis Date  . Arthritis   . Hypertension     Past Surgical History:  Procedure Laterality Date  . KNEE ARTHROPLASTY Right    x2  . KNEE ARTHROSCOPY Left     No current facility-administered medications for this encounter.   Current Outpatient Medications  Medication Sig Dispense Refill Last Dose  . amLODipine (NORVASC) 10 MG tablet Take 10 mg by mouth daily.     . meloxicam (MOBIC) 15 MG tablet Take 1 tablet (15 mg total) by mouth daily. 30 tablet 0    No Known Allergies  Social History   Tobacco Use  . Smoking status: Never Smoker  . Smokeless tobacco: Never Used  Substance Use Topics  . Alcohol use: Never    No family history on file.   Review of Systems  All other systems reviewed and are negative.   Objective:  Physical Exam  Vital signs in last 24 hours:    Labs:   Estimated body mass index is 34.58 kg/m as calculated from the following:   Height as of 03/17/20: 5\' 10"  (1.778 m).   Weight as of 03/17/20: 109.3  kg.   Imaging Review Plain radiographs demonstrate moderate degenerative joint disease of the left knee(s). The overall alignment isneutral. The bone quality appears to be good for age and reported activity level.      Assessment/Plan:  End stage arthritis, left knee   The patient history, physical examination, clinical judgment of the provider and imaging studies are consistent with end stage degenerative joint disease of the left knee(s) and total knee arthroplasty is deemed medically necessary. The treatment options including medical management, injection therapy arthroscopy and arthroplasty were discussed at length. The risks and benefits of total knee arthroplasty were presented and reviewed. The risks due to aseptic loosening, infection, stiffness, patella tracking problems, thromboembolic complications and other imponderables were discussed. The patient acknowledged the explanation, agreed to proceed with the plan and consent was signed. Patient is being admitted for inpatient treatment for surgery, pain control, PT, OT, prophylactic antibiotics, VTE prophylaxis, progressive ambulation and ADL's and discharge planning. The patient is planning to be discharged home with home health services     Patient's anticipated LOS is less than 2 midnights, meeting these requirements: - Younger than 70 - Lives within 1 hour of care - Has a competent adult at home to recover with post-op recover - NO history of  - Chronic pain requiring opiods  - Diabetes  - Coronary Artery Disease  - Heart failure  - Heart attack  - Stroke  -  DVT/VTE  - Cardiac arrhythmia  - Respiratory Failure/COPD  - Renal failure  - Anemia  - Advanced Liver disease

## 2020-03-19 NOTE — Interval H&P Note (Signed)
History and Physical Interval Note:  03/19/2020 6:48 AM  Nicholas Gregory  has presented today for surgery, with the diagnosis of Osteoarthritis Left Knee.  The various methods of treatment have been discussed with the patient and family. After consideration of risks, benefits and other options for treatment, the patient has consented to  Procedure(s): LEFT TOTAL KNEE ARTHROPLASTY (Left) as a surgical intervention.  The patient's history has been reviewed, patient examined, no change in status, stable for surgery.  I have reviewed the patient's chart and labs.  Questions were answered to the patient's satisfaction.     Nadara Mustard

## 2020-03-19 NOTE — OR Nursing (Signed)
Pt is awake,alert and oriented.Pt and/or family verbalized understanding of poc and discharge instructions. Reviewed admission and on going care with receiving RN. Pt is in NAD at this time and is ready to be transferred to floor. Will con't to monitor until pt is transferred. Belongings on bed with patient Iceman in place Pt ate 5-6 crackers and 2 cups of water in [facility] without difficulty.

## 2020-03-19 NOTE — Discharge Instructions (Signed)
INSTRUCTIONS AFTER JOINT REPLACEMENT   o Remove items at home which could result in a fall. This includes throw rugs or furniture in walking pathways o ICE to the affected joint every three hours while awake for 30 minutes at a time, for at least the first 3-5 days, and then as needed for pain and swelling.  Continue to use ice for pain and swelling. You may notice swelling that will progress down to the foot and ankle.  This is normal after surgery.  Elevate your leg when you are not up walking on it.   o Continue to use the breathing machine you got in the hospital (incentive spirometer) which will help keep your temperature down.  It is common for your temperature to cycle up and down following surgery, especially at night when you are not up moving around and exerting yourself.  The breathing machine keeps your lungs expanded and your temperature down.   DIET:  As you were doing prior to hospitalization, we recommend a well-balanced diet.  DRESSING / WOUND CARE / SHOWERING  Keep surgical dressing dry and in place until follow up  ACTIVITY  o Increase activity slowly as tolerated, but follow the weight bearing instructions below.   o No driving for 6 weeks or until further direction given by your physician.  You cannot drive while taking narcotics.  o No lifting or carrying greater than 10 lbs. until further directed by your surgeon. o Avoid periods of inactivity such as sitting longer than an hour when not asleep. This helps prevent blood clots.  o You may return to work once you are authorized by your doctor.     WEIGHT BEARING   Weight bearing as tolerated with assist device (walker, cane, etc) as directed, use it as long as suggested by your surgeon or therapist, typically at least 4-6 weeks.   EXERCISES  Results after joint replacement surgery are often greatly improved when you follow the exercise, range of motion and muscle strengthening exercises prescribed by your doctor.  Safety measures are also important to protect the joint from further injury. Any time any of these exercises cause you to have increased pain or swelling, decrease what you are doing until you are comfortable again and then slowly increase them. If you have problems or questions, call your caregiver or physical therapist for advice.   Rehabilitation is important following a joint replacement. After just a few days of immobilization, the muscles of the leg can become weakened and shrink (atrophy).  These exercises are designed to build up the tone and strength of the thigh and leg muscles and to improve motion. Often times heat used for twenty to thirty minutes before working out will loosen up your tissues and help with improving the range of motion but do not use heat for the first two weeks following surgery (sometimes heat can increase post-operative swelling).   These exercises can be done on a training (exercise) mat, on the floor, on a table or on a bed. Use whatever works the best and is most comfortable for you.    Use music or television while you are exercising so that the exercises are a pleasant break in your day. This will make your life better with the exercises acting as a break in your routine that you can look forward to.   Perform all exercises about fifteen times, three times per day or as directed.  You should exercise both the operative leg and the other leg  as well.  Exercises include:   . Quad Sets - Tighten up the muscle on the front of the thigh (Quad) and hold for 5-10 seconds.   . Straight Leg Raises - With your knee straight (if you were given a brace, keep it on), lift the leg to 60 degrees, hold for 3 seconds, and slowly lower the leg.  Perform this exercise against resistance later as your leg gets stronger.  . Leg Slides: Lying on your back, slowly slide your foot toward your buttocks, bending your knee up off the floor (only go as far as is comfortable). Then slowly slide  your foot back down until your leg is flat on the floor again.  Nicholas Gregory Wings: Lying on your back spread your legs to the side as far apart as you can without causing discomfort.  . Hamstring Strength:  Lying on your back, push your heel against the floor with your leg straight by tightening up the muscles of your buttocks.  Repeat, but this time bend your knee to a comfortable angle, and push your heel against the floor.  You may put a pillow under the heel to make it more comfortable if necessary.   A rehabilitation program following joint replacement surgery can speed recovery and prevent re-injury in the future due to weakened muscles. Contact your doctor or a physical therapist for more information on knee rehabilitation.    CONSTIPATION  Constipation is defined medically as fewer than three stools per week and severe constipation as less than one stool per week.  Even if you have a regular bowel pattern at home, your normal regimen is likely to be disrupted due to multiple reasons following surgery.  Combination of anesthesia, postoperative narcotics, change in appetite and fluid intake all can affect your bowels.   YOU MUST use at least one of the following options; they are listed in order of increasing strength to get the job done.  They are all available over the counter, and you may need to use some, POSSIBLY even all of these options:    Drink plenty of fluids (prune juice may be helpful) and high fiber foods Colace 100 mg by mouth twice a day  Senokot for constipation as directed and as needed Dulcolax (bisacodyl), take with full glass of water  Miralax (polyethylene glycol) once or twice a day as needed.  If you have tried all these things and are unable to have a bowel movement in the first 3-4 days after surgery call either your surgeon or your primary doctor.    If you experience loose stools or diarrhea, hold the medications until you stool forms back up.  If your symptoms do  not get better within 1 week or if they get worse, check with your doctor.  If you experience "the worst abdominal pain ever" or develop nausea or vomiting, please contact the office immediately for further recommendations for treatment.   ITCHING:  If you experience itching with your medications, try taking only a single pain pill, or even half a pain pill at a time.  You can also use Benadryl over the counter for itching or also to help with sleep.   TED HOSE STOCKINGS:  Use stockings on both legs until for at least 2 weeks or as directed by physician office. They may be removed at night for sleeping.  MEDICATIONS:  See your medication summary on the "After Visit Summary" that nursing will review with you.  You may have some home  medications which will be placed on hold until you complete the course of blood thinner medication.  It is important for you to complete the blood thinner medication as prescribed.  PRECAUTIONS:  If you experience chest pain or shortness of breath - call 911 immediately for transfer to the hospital emergency department.   If you develop a fever greater that 101 F, purulent drainage from wound, increased redness or drainage from wound, foul odor from the wound/dressing, or calf pain - CONTACT YOUR SURGEON.                                                   FOLLOW-UP APPOINTMENTS:  If you do not already have a post-op appointment, please call the office for an appointment to be seen by your surgeon.  Guidelines for how soon to be seen are listed in your "After Visit Summary", but are typically between 1-4 weeks after surgery.  OTHER INSTRUCTIONS:   Knee Replacement:  Do not place pillow under knee, focus on keeping the knee straight while resting. CPM instructions: 0-90 degrees, 2 hours in the morning, 2 hours in the afternoon, and 2 hours in the evening. Place foam block, curve side up under heel at all times except when in CPM or when walking.  DO NOT modify, tear, cut, or  change the foam block in any way.   DENTAL ANTIBIOTICS:  In most cases prophylactic antibiotics for Dental procdeures after total joint surgery are not necessary.  Exceptions are as follows:  1. History of prior total joint infection  2. Severely immunocompromised (Organ Transplant, cancer chemotherapy, Rheumatoid biologic meds such as Humera)  3. Poorly controlled diabetes (A1C &gt; 8.0, blood glucose over 200)  If you have one of these conditions, contact your surgeon for an antibiotic prescription, prior to your dental procedure.   MAKE SURE YOU:  . Understand these instructions.  . Get help right away if you are not doing well or get worse.    Thank you for letting us be a part of your medical care team.  It is a privilege we respect greatly.  We hope these instructions will help you stay on track for a fast and full recovery!

## 2020-03-19 NOTE — OR Nursing (Signed)
Iceman in place, attached to patient, and functioning.

## 2020-03-19 NOTE — Anesthesia Procedure Notes (Signed)
Anesthesia Regional Block: Adductor canal block   Pre-Anesthetic Checklist: ,, timeout performed, Correct Patient, Correct Site, Correct Laterality, Correct Procedure, Correct Position, site marked, Risks and benefits discussed, pre-op evaluation,  At surgeon's request and post-op pain management  Laterality: Left  Prep: Maximum Sterile Barrier Precautions used, chloraprep       Needles:  Injection technique: Single-shot  Needle Type: Echogenic Stimulator Needle     Needle Length: 9cm  Needle Gauge: 21     Additional Needles:   Procedures:,,,, ultrasound used (permanent image in chart),,,,  Narrative:  Start time: 03/19/2020 7:54 AM End time: 03/19/2020 8:04 AM Injection made incrementally with aspirations every 5 mL.  Performed by: Personally  Anesthesiologist: Gaynelle Adu, MD  Additional Notes: 2% Lidocaine skin wheel.

## 2020-03-19 NOTE — Op Note (Signed)
DATE OF SURGERY:  03/19/2020  TIME: 10:09 AM  PATIENT NAME:  Nicholas Gregory    AGE: 59 y.o.    PRE-OPERATIVE DIAGNOSIS:  Osteoarthritis Left Knee  POST-OPERATIVE DIAGNOSIS:  Osteoarthritis Left Knee  PROCEDURE:  Procedure(s): LEFT TOTAL KNEE ARTHROPLASTY  SURGEON: Aldean Baker  ASSISTANT: April green  OPERATIVE IMPLANTS: Depuy , Posterior Stabilized.  Femur size 6, Tibia size 7, Patella size 38 3-peg oval button, with a 5 mm polyethylene insert.  @ENCIMAGES @    PREOPERATIVE INDICATIONS:   Nicholas Gregory is a 59 y.o. year old male with end stage degenerative arthritis of the knee who failed conservative treatment and elected for Total Knee Arthroplasty.   The risks, benefits, and alternatives were discussed at length including but not limited to the risks of infection, bleeding, nerve injury, stiffness, blood clots, the need for revision surgery, cardiopulmonary complications, among others, and they were willing to proceed.  OPERATIVE DESCRIPTION:  The patient was brought to the operative room and placed in a supine position.  General anesthesia was administered.  IV antibiotics were given.  The lower extremity was prepped and draped in the usual sterile fashion.  46 was used to cover all exposed skin. Time out was performed.    Anterior quadriceps tendon splitting approach was performed.  The patella was everted and osteophytes were removed.  The anterior horn of the medial and lateral meniscus was removed.   The distal femur was opened with the drill and the intramedullary distal femoral cutting jig was utilized, set at 5 degrees valgus resecting 9 mm off the distal femur.  Care was taken to protect the collateral ligaments.  Then the extramedullary tibial cutting jig was utilized set for 3 degree posterior slope.  Care was taken during the cut to protect the medial and collateral ligaments.  The proximal tibia was removed along with the posterior horns of the  menisci.  The PCL was sacrificed.    The extensor gap was measured and was approximately 5 mm.    The distal femoral sizing jig was applied, taking care to avoid notching.  Then the 4-in-1 cutting jig was applied and the anterior and posterior femur was cut, along with the chamfer cuts.  All posterior osteophytes were removed.  The flexion gap was then measured and was symmetric with the extension gap.  The distal femoral preparation using the appropriate jig to prepare the box.  The patella was then measured, and cut with the saw.    The proximal tibia sized and prepared accordingly with the reamer and the punch, and then all components were trialed with the poly insert.  The knee was found to have stable balance and full motion.  The knee was irrigated with normal saline and the knee was soaked with TXA.  The above named components were then cemented into place and all excess cement was removed.  The final polyethylene component was in place during cementation.  The knee was kept in extension until the cement hardened.  The knee was then taken through a range of motion and the patella tracked well and the knee irrigated copiously and the parapatellar and subcutaneous tissue closed with vicryl, and skin closed with staples..  A sterile dressing was applied and patient  was taken to the PACU in stable  condition.  There were no complications.  Total tourniquet time was 39 minutes.

## 2020-03-20 ENCOUNTER — Encounter (HOSPITAL_COMMUNITY): Payer: Self-pay | Admitting: Orthopedic Surgery

## 2020-03-20 DIAGNOSIS — M1712 Unilateral primary osteoarthritis, left knee: Secondary | ICD-10-CM | POA: Diagnosis not present

## 2020-03-20 MED ORDER — ASPIRIN 325 MG PO TBEC: 325.0000 mg | DELAYED_RELEASE_TABLET | Freq: Every day | ORAL | 0 refills | Status: DC

## 2020-03-20 MED ORDER — OXYCODONE-ACETAMINOPHEN 5-325 MG PO TABS: 1.0000 | ORAL_TABLET | ORAL | 0 refills | Status: AC | PRN

## 2020-03-20 NOTE — Plan of Care (Signed)
  Problem: Clinical Measurements: Goal: Ability to maintain clinical measurements within normal limits will improve Outcome: Progressing Goal: Will remain free from infection Outcome: Progressing Goal: Diagnostic test results will improve Outcome: Progressing Goal: Respiratory complications will improve Outcome: Progressing Goal: Cardiovascular complication will be avoided Outcome: Progressing   Problem: Activity: Goal: Risk for activity intolerance will decrease Outcome: Progressing   Problem: Elimination: Goal: Will not experience complications related to bowel motility Outcome: Progressing Goal: Will not experience complications related to urinary retention Outcome: Progressing   Problem: Safety: Goal: Ability to remain free from injury will improve Outcome: Progressing   Problem: Skin Integrity: Goal: Risk for impaired skin integrity will decrease Outcome: Progressing   

## 2020-03-20 NOTE — Plan of Care (Signed)

## 2020-03-20 NOTE — Progress Notes (Signed)
Physical Therapy Treatment Patient Details Name: Nicholas Gregory MRN: [id number] DOB: 1960-05-22 Today's Date: 03/20/2020    History of Present Illness Pt is a 59 y/o male s/p L TKA. PMH includes HTN.    PT Comments    Pt supine on arrival, agreeable to therapy session and with good participation and tolerance for mobility. Primary session focus on progressing safe transfers, gait training and LLE exercise. Pt progressed gait distance to 128ft using RW and min guard, no overt buckling although pt with continued L quad weakness and heavy use of BUE to offload LLE during gait. Pt performed bed mobility with minA and transfers with modA from low height surface to RW and Supervision from elevated surface to RW. Pt performed seated/supine LLE A/AAROM as detailed below, with notable L quad weakness. L knee flexion ROM grossly 10 to 80 degrees seated, assessment slightly limited due to edema and L ace wrapped/dressed. Pt continues to benefit from PT services to progress toward functional mobility goals. D/C recs below, pending progress; anticipate pt will need at least 1-2 more sessions prior to D/C home with HHPT.  Follow Up Recommendations  Follow surgeon's recommendation for DC plan and follow-up therapies     Equipment Recommendations  Rolling walker with 5" wheels;3in1 (PT)    Recommendations for Other Services       Precautions / Restrictions Precautions Precautions: Knee Precaution Booklet Issued: Yes (comment) Precaution Comments: Verbally reviewed knee precautions and TKA handout given Restrictions Weight Bearing Restrictions: Yes LLE Weight Bearing: Weight bearing as tolerated    Mobility  Bed Mobility Overal bed mobility: Needs Assistance Bed Mobility: Supine to Sit;Sit to Supine     Supine to sit: Min assist Sit to supine: Min assist   General bed mobility comments: Min A For LLE management. Increased time to perform as pt reporting increased pain. PTA having to hold  LLE throughout both transfers as pt did not want to let it bend initiallly. Cues for hooking RLE under LLE to assist however pt reports he is unable due to RLE discomfort from previous surgical hx  Transfers Overall transfer level: Needs assistance Equipment used: Rolling walker (2 wheeled) Transfers: Sit to/from Stand Sit to Stand: Mod assist         General transfer comment: modA from lowest bed height to RW, cues for hand placement; greatly increased time to perform, cues for posture needed; Supervision to stand from highest BSC height with rail assist  Ambulation/Gait Ambulation/Gait assistance: Min guard Gait Distance (Feet): 150 Feet Assistive device: Rolling walker (2 wheeled) Gait Pattern/deviations: Step-to pattern;Decreased step length - right;Decreased step length - left;Decreased weight shift to left;Antalgic Gait velocity: Decreased   General Gait Details: pt with slightly increased WB tolerance on LLE this session, but has difficulty with L terminal knee extension and heavy use of BUE to offload LLE due to pain; no buckling   Stairs             Wheelchair Mobility    Modified Rankin (Stroke Patients Only)       Balance Overall balance assessment: Needs assistance Sitting-balance support: No upper extremity supported Sitting balance-Leahy Scale: Good     Standing balance support: Bilateral upper extremity supported Standing balance-Leahy Scale: Poor Standing balance comment: Reliant on BUE support                            Cognition Arousal/Alertness: Awake/alert Behavior During Therapy: WFL for tasks assessed/performed Overall  Cognitive Status: Within Functional Limits for tasks assessed                                 General Comments: pleasantly cooperative      Exercises Total Joint Exercises Ankle Circles/Pumps: AROM;Both;10 reps;Supine (poor quad contraction on LLE) Quad Sets: AROM;Strengthening;Both;10  reps;Supine Short Arc Quad: AAROM;Left;5 reps;Supine;PROM (pt nearly total A to perform; poor quad contraction) Hip ABduction/ADduction: AAROM;Left;10 reps;Supine Long Arc Quad: AAROM;PROM;Left;Seated;5 reps (needs totalA to extend L knee fully; minimal ability to extend from floor without assist) Knee Flexion: AAROM;Left;10 reps;5 reps;Seated Goniometric ROM: L knee flexion ROM 10 to 80 deg in seated    General Comments        Pertinent Vitals/Pain Pain Assessment: 0-10 Pain Score: 5  Pain Location: L knee Pain Descriptors / Indicators: Aching;Operative site guarding Pain Intervention(s): Monitored during session;Premedicated before session;Repositioned;Ice applied   Vitals:   03/20/20 0801  BP: (!) 148/86  Pulse: 72  Resp: 17  Temp: 98.5 F (36.9 C)  SpO2: 96%    Home Living                      Prior Function            PT Goals (current goals can now be found in the care plan section) Acute Rehab PT Goals Patient Stated Goal: to go home PT Goal Formulation: With patient Time For Goal Achievement: 04/02/20 Potential to Achieve Goals: Good Progress towards PT goals: Progressing toward goals    Frequency    7X/week      PT Plan Current plan remains appropriate    Co-evaluation              AM-PAC PT "6 Clicks" Mobility   Outcome Measure  Help needed turning from your back to your side while in a flat bed without using bedrails?: None Help needed moving from lying on your back to sitting on the side of a flat bed without using bedrails?: A Little Help needed moving to and from a bed to a chair (including a wheelchair)?: A Little Help needed standing up from a chair using your arms (e.g., wheelchair or bedside chair)?: A Little Help needed to walk in hospital room?: A Little Help needed climbing 3-5 steps with a railing? : A Lot 6 Click Score: 18    End of Session Equipment Utilized During Treatment: Gait belt Activity Tolerance: Patient  tolerated treatment well Patient left: in bed;with call bell/phone within reach;with bed alarm set;with SCD's reapplied (iceman donned; pt requesting bed wanting to take a nap) Nurse Communication: Mobility status PT Visit Diagnosis: Other abnormalities of gait and mobility (R26.89);Pain Pain - Right/Left: Left Pain - part of body: Knee     Time: 5364-6803 PT Time Calculation (min) (ACUTE ONLY): 42 min  Charges:  $Gait Training: 8-22 mins $Therapeutic Exercise: 8-22 mins $Therapeutic Activity: 8-22 mins                     Demerius Podolak P., PTA Acute Rehabilitation Services Pager: [phone] Office: [phone]   Angus Palms 03/20/2020, 10:36 AM

## 2020-03-20 NOTE — TOC Initial Note (Signed)
Transition of Care [facility]) - Initial/Assessment Note    Patient Details  Name: Nicholas Gregory MRN: [id number] Date of Birth: 1960/07/02  [facility] CM/SW Contact:    Curlene Labrum, RN Phone Number: 03/20/2020, 11:11 AM  Clinical Narrative:                 Case management met with the patient at the bedside regarding transitions of care to home S/P Left knee TKA.  The patient lives at home with his wife and was previously set up with [facility] for home health PT.  The patient was made aware of home health provider.  The patient does not have a RW nor 3:1 at home and will need this provided to him through the 5N supply closet from [employer].  The patient has not other needs at this time and will be able to discharge home once Dr. Sharol Given has cleared him from an orthopedic standpoint.   Expected Discharge Plan: [place] Barriers to Discharge: No Barriers Identified   Patient Goals and CMS Choice Patient states their goals for this hospitalization and ongoing recovery are:: Patient plans to discharge home with his wife and home health services. CMS Medicare.gov Compare Post Acute Care list provided to:: Patient Choice offered to / list presented to : Patient  Expected Discharge Plan and Services Expected Discharge Plan: [place]   Discharge Planning Services: CM Consult Post Acute Care Choice: [place] arrangements for the past 2 months: Single Family Home Expected Discharge Date: 03/21/20               DME Arranged: 3-N-1,Walker rolling DME Agency: [employer] Date DME Agency Contacted: 03/20/20 Time DME Agency Contacted: 87 Representative spoke with at DME Agency: [employer] RW and 3:1 to be given to patient from 5N supply closet. [place] Arranged: PT [place] Agency: [facility] (now [facility]) Date [place]: 03/20/20 Time [place]: 80 Representative  spoke with at [place]: Drue [place], [place] with [facility] [place] Arrangements/Services Living arrangements for the past 2 months: [place] with:: Spouse Patient language and need for interpreter reviewed:: Yes Do you feel safe going back to the place where you live?: Yes      Need for Family Participation in Patient Care: Yes (Comment) Care giver support system in place?: Yes (comment)   Criminal Activity/Legal Involvement Pertinent to Current Situation/Hospitalization: No - Comment as needed  Activities of Daily Living Home Assistive Devices/Equipment: Eyeglasses ADL Screening (condition at time of admission) Patient's cognitive ability adequate to safely complete daily activities?: Yes Is the patient deaf or have difficulty hearing?: No Does the patient have difficulty seeing, even when wearing glasses/contacts?: No Does the patient have difficulty concentrating, remembering, or making decisions?: No Patient able to express need for assistance with ADLs?: Yes Does the patient have difficulty dressing or bathing?: No Independently performs ADLs?: Yes (appropriate for developmental age) Does the patient have difficulty walking or climbing stairs?: Yes Weakness of Legs: Left Weakness of Arms/Hands: None  Permission Sought/Granted Permission sought to share information with : Case Manager Permission granted to share information with : Yes, Verbal Permission Granted     Permission granted to share info w AGENCY: [facility], dme from [place] granted to share info w Relationship: spouse - Vaughan Basta     Emotional Assessment Appearance:: Appears stated age Attitude/Demeanor/Rapport:  Gracious Affect (typically observed): Accepting Orientation: : Oriented to Self,Oriented to Place,Oriented to  Time,Oriented to Situation Alcohol / Substance Use: Not Applicable Psych Involvement: No (comment)  Admission diagnosis:  Arthritis of  left knee [M17.12] Patient Active Problem List   Diagnosis Date Noted  . Arthritis of left knee 03/19/2020  . Unilateral primary osteoarthritis, left knee    PCP:  Thompson Grayer, MD Pharmacy:   [id number] [id number]: [phone]: [phone]    Social Determinants of Health (SDOH) Interventions    Readmission Risk Interventions No flowsheet data found.

## 2020-03-20 NOTE — Progress Notes (Signed)
Physical Therapy Treatment Patient Details Name: Nicholas Gregory MRN: [id number] DOB: [birth year] Today's Date: 03/20/2020    History of Present Illness Pt is a 59 y/o male s/p L TKA. PMH includes HTN.    PT Comments    Pt supine on arrival, agreeable to therapy session, spouse present and supportive. Pt able to progress gait distance to 253ft using RW, gait speed very slow ~0.2-0.3 m/s indicating an increased risk of falls for household ambulation tasks. Pt with slightly improved quad contraction compared with AM session but remains unable to perform SAQ unassisted. Pt needing increased assist to stand from lowest bed height (modA) and needs cues for hand placement/safety. Pt continues to benefit from PT services to progress toward functional mobility goals. D/C recs below, anticipate pt will be safe to D/C home in 1-2 more sessions, pending progress.   Follow Up Recommendations  Follow surgeon's recommendation for DC plan and follow-up therapies     Equipment Recommendations  Rolling walker with 5" wheels;3in1 (PT)    Recommendations for Other Services       Precautions / Restrictions Precautions Precautions: Knee Precaution Booklet Issued: Yes (comment) Precaution Comments: Verbally reviewed knee precautions and TKA handout given Restrictions Weight Bearing Restrictions: Yes LLE Weight Bearing: Weight bearing as tolerated    Mobility  Bed Mobility Overal bed mobility: Needs Assistance Bed Mobility: Supine to Sit;Sit to Supine     Supine to sit: Min assist Sit to supine: Min assist   General bed mobility comments: Min A For LLE management. Increased time to perform as pt reporting increased pain. PTA having to hold LLE throughout both transfers as pt did not want to let it bend initiallly. Cues for hooking RLE under LLE to assist however pt reports he is unable due to RLE discomfort from previous surgical hx  Transfers Overall transfer level: Needs  assistance Equipment used: Rolling walker (2 wheeled) Transfers: Sit to/from Stand Sit to Stand: Mod assist         General transfer comment: modA from lowest bed height to RW, cues for hand placement; greatly increased time to perform, cues for posture needed; Supervision to stand from highest BSC height with rail assist  Ambulation/Gait Ambulation/Gait assistance: Min guard   Assistive device: Rolling walker (2 wheeled) Gait Pattern/deviations: Step-to pattern;Decreased step length - right;Decreased step length - left;Decreased weight shift to left;Antalgic Gait velocity: Decreased   General Gait Details: pt with slightly increased WB tolerance on LLE this session, but has difficulty with L terminal knee extension and heavy use of BUE to offload LLE due to pain; no buckling   Stairs             Wheelchair Mobility    Modified Rankin (Stroke Patients Only)       Balance Overall balance assessment: Needs assistance Sitting-balance support: No upper extremity supported Sitting balance-Leahy Scale: Good     Standing balance support: Bilateral upper extremity supported Standing balance-Leahy Scale: Poor Standing balance comment: Reliant on BUE support                            Cognition Arousal/Alertness: Awake/alert Behavior During Therapy: WFL for tasks assessed/performed Overall Cognitive Status: Within Functional Limits for tasks assessed                                 General Comments: pleasantly cooperative  Exercises Total Joint Exercises Ankle Circles/Pumps: AROM;Both;10 reps;Supine (poor quad contraction on LLE) Quad Sets: AROM;Strengthening;Both;10 reps;Supine Gluteal Sets: AROM;Both;10 reps;Supine Short Arc Quad: AAROM;Left;5 reps;Supine;PROM (pt nearly total A to perform; poor quad contraction) Heel Slides: AAROM;Strengthening;Left;10 reps;Supine Hip ABduction/ADduction: AAROM;Left;10 reps;Supine Long Arc Quad:  AAROM;PROM;Left;Seated;5 reps (needs totalA to extend L knee fully; minimal ability to extend from floor without assist) Knee Flexion: AAROM;Left;10 reps;5 reps;Seated Goniometric ROM: 10 to 80 deg seated L knee flexion ROM    General Comments        Pertinent Vitals/Pain Pain Assessment: 0-10 Pain Score: 3  Pain Location: L knee Pain Descriptors / Indicators: Aching;Operative site guarding Pain Intervention(s): Monitored during session;Repositioned;Patient requesting pain meds-RN notified;Ice applied   Orthostatic BPs  Sitting 168/99 (114)  Sitting after 3 min 167/88  Standing 159/80    Home Living                      Prior Function            PT Goals (current goals can now be found in the care plan section) Acute Rehab PT Goals Patient Stated Goal: to go home PT Goal Formulation: With patient Time For Goal Achievement: 04/02/20 Potential to Achieve Goals: Good Progress towards PT goals: Progressing toward goals    Frequency    7X/week      PT Plan Current plan remains appropriate    Co-evaluation              AM-PAC PT "6 Clicks" Mobility   Outcome Measure  Help needed turning from your back to your side while in a flat bed without using bedrails?: None Help needed moving from lying on your back to sitting on the side of a flat bed without using bedrails?: A Little Help needed moving to and from a bed to a chair (including a wheelchair)?: A Little Help needed standing up from a chair using your arms (e.g., wheelchair or bedside chair)?: A Lot Help needed to walk in hospital room?: A Little Help needed climbing 3-5 steps with a railing? : A Lot 6 Click Score: 17    End of Session Equipment Utilized During Treatment: Gait belt Activity Tolerance: Patient tolerated treatment well Patient left: with call bell/phone within reach;in chair;with family/visitor present (iceman donned) Nurse Communication: Mobility status PT Visit Diagnosis:  Other abnormalities of gait and mobility (R26.89);Pain Pain - Right/Left: Left Pain - part of body: Knee     Time: 9476-5465 PT Time Calculation (min) (ACUTE ONLY): 49 min  Charges:  $Gait Training: 23-37 mins $Therapeutic Exercise: 8-22 mins                     Nicholas Gregory P., PTA Acute Rehabilitation Services Pager: [phone] Office: [phone]   Angus Palms 03/20/2020, 5:30 PM

## 2020-03-21 DIAGNOSIS — M1712 Unilateral primary osteoarthritis, left knee: Secondary | ICD-10-CM | POA: Diagnosis not present

## 2020-03-21 NOTE — Plan of Care (Signed)
°  Problem: Education: Goal: Knowledge of General Education information will improve Description: Including pain rating scale, medication(s)/side effects and non-pharmacologic comfort measures 03/21/2020 0838 by Durward Fortes, RN Outcome: Progressing 03/21/2020 0834 by Durward Fortes, RN Outcome: Progressing   Problem: Health Behavior/Discharge Planning: Goal: Ability to manage health-related needs will improve 03/21/2020 0838 by Durward Fortes, RN Outcome: Progressing 03/21/2020 0834 by Durward Fortes, RN Outcome: Progressing   Problem: Clinical Measurements: Goal: Ability to maintain clinical measurements within normal limits will improve 03/21/2020 0838 by Durward Fortes, RN Outcome: Progressing 03/21/2020 0834 by Durward Fortes, RN Outcome: Progressing Goal: Will remain free from infection 03/21/2020 0838 by Durward Fortes, RN Outcome: Progressing 03/21/2020 0834 by Durward Fortes, RN Outcome: Progressing Goal: Diagnostic test results will improve 03/21/2020 0838 by Durward Fortes, RN Outcome: Progressing 03/21/2020 0834 by Durward Fortes, RN Outcome: Progressing Goal: Respiratory complications will improve 03/21/2020 0838 by Durward Fortes, RN Outcome: Progressing 03/21/2020 0834 by Durward Fortes, RN Outcome: Progressing Goal: Cardiovascular complication will be avoided 03/21/2020 0838 by Durward Fortes, RN Outcome: Progressing 03/21/2020 0834 by Durward Fortes, RN Outcome: Progressing   Problem: Activity: Goal: Risk for activity intolerance will decrease 03/21/2020 0838 by Durward Fortes, RN Outcome: Progressing 03/21/2020 0834 by Durward Fortes, RN Outcome: Progressing   Problem: Nutrition: Goal: Adequate nutrition will be maintained 03/21/2020 0838 by Durward Fortes, RN Outcome: Progressing 03/21/2020 0834 by Durward Fortes, RN Outcome: Progressing   Problem: Coping: Goal: Level of anxiety will decrease 03/21/2020 0838 by Durward Fortes, RN Outcome:  Progressing 03/21/2020 0834 by Durward Fortes, RN Outcome: Progressing   Problem: Elimination: Goal: Will not experience complications related to bowel motility 03/21/2020 0838 by Durward Fortes, RN Outcome: Progressing 03/21/2020 0834 by Durward Fortes, RN Outcome: Progressing Goal: Will not experience complications related to urinary retention 03/21/2020 0838 by Durward Fortes, RN Outcome: Progressing 03/21/2020 0834 by Durward Fortes, RN Outcome: Progressing   Problem: Pain Managment: Goal: General experience of comfort will improve 03/21/2020 0838 by Durward Fortes, RN Outcome: Progressing 03/21/2020 0834 by Durward Fortes, RN Outcome: Progressing   Problem: Safety: Goal: Ability to remain free from injury will improve 03/21/2020 0838 by Durward Fortes, RN Outcome: Progressing 03/21/2020 0834 by Durward Fortes, RN Outcome: Progressing   Problem: Skin Integrity: Goal: Risk for impaired skin integrity will decrease 03/21/2020 0838 by Durward Fortes, RN Outcome: Progressing 03/21/2020 0834 by Durward Fortes, RN Outcome: Progressing

## 2020-03-21 NOTE — Progress Notes (Signed)
Patient is alert and awake.  He said he had a difficult night last night secondary to his ice machine not having icing it and the unit got quite warm.  Doing better now.  Vital signs stable afebrile.  Plan for discharge to home today follow-up in our office after Christmas

## 2020-03-21 NOTE — Plan of Care (Signed)
Problem: Education: Goal: Knowledge of General Education information will improve Description: Including pain rating scale, medication(s)/side effects and non-pharmacologic comfort measures 03/21/2020 1120 by Durward Fortes, RN Outcome: Adequate for Discharge 03/21/2020 [phone] by Durward Fortes, RN Outcome: Progressing 03/21/2020 0834 by Durward Fortes, RN Outcome: Progressing   Problem: Health Behavior/Discharge Planning: Goal: Ability to manage health-related needs will improve 03/21/2020 1120 by Durward Fortes, RN Outcome: Adequate for Discharge 03/21/2020 [phone] by Durward Fortes, RN Outcome: Progressing 03/21/2020 0834 by Durward Fortes, RN Outcome: Progressing   Problem: Clinical Measurements: Goal: Ability to maintain clinical measurements within normal limits will improve 03/21/2020 1120 by Durward Fortes, RN Outcome: Adequate for Discharge 03/21/2020 0838 by Durward Fortes, RN Outcome: Progressing 03/21/2020 0834 by Durward Fortes, RN Outcome: Progressing Goal: Will remain free from infection 03/21/2020 1120 by Durward Fortes, RN Outcome: Adequate for Discharge 03/21/2020 [phone] by Durward Fortes, RN Outcome: Progressing 03/21/2020 0834 by Durward Fortes, RN Outcome: Progressing Goal: Diagnostic test results will improve 03/21/2020 1120 by Durward Fortes, RN Outcome: Adequate for Discharge 03/21/2020 [phone] by Durward Fortes, RN Outcome: Progressing 03/21/2020 0834 by Durward Fortes, RN Outcome: Progressing Goal: Respiratory complications will improve 03/21/2020 1120 by Durward Fortes, RN Outcome: Adequate for Discharge 03/21/2020 [phone] by Durward Fortes, RN Outcome: Progressing 03/21/2020 0834 by Durward Fortes, RN Outcome: Progressing Goal: Cardiovascular complication will be avoided 03/21/2020 1120 by Durward Fortes, RN Outcome: Adequate for Discharge 03/21/2020 [phone] by Durward Fortes, RN Outcome: Progressing 03/21/2020 0834 by Durward Fortes, RN Outcome:  Progressing   Problem: Activity: Goal: Risk for activity intolerance will decrease 03/21/2020 1120 by Durward Fortes, RN Outcome: Adequate for Discharge 03/21/2020 [phone] by Durward Fortes, RN Outcome: Progressing 03/21/2020 0834 by Durward Fortes, RN Outcome: Progressing   Problem: Nutrition: Goal: Adequate nutrition will be maintained 03/21/2020 1120 by Durward Fortes, RN Outcome: Adequate for Discharge 03/21/2020 [phone] by Durward Fortes, RN Outcome: Progressing 03/21/2020 0834 by Durward Fortes, RN Outcome: Progressing   Problem: Coping: Goal: Level of anxiety will decrease 03/21/2020 1120 by Durward Fortes, RN Outcome: Adequate for Discharge 03/21/2020 [phone] by Durward Fortes, RN Outcome: Progressing 03/21/2020 0834 by Durward Fortes, RN Outcome: Progressing   Problem: Elimination: Goal: Will not experience complications related to bowel motility 03/21/2020 1120 by Durward Fortes, RN Outcome: Adequate for Discharge 03/21/2020 0838 by Durward Fortes, RN Outcome: Progressing 03/21/2020 0834 by Durward Fortes, RN Outcome: Progressing Goal: Will not experience complications related to urinary retention 03/21/2020 1120 by Durward Fortes, RN Outcome: Adequate for Discharge 03/21/2020 0838 by Durward Fortes, RN Outcome: Progressing 03/21/2020 0834 by Durward Fortes, RN Outcome: Progressing   Problem: Pain Managment: Goal: General experience of comfort will improve 03/21/2020 1120 by Durward Fortes, RN Outcome: Adequate for Discharge 03/21/2020 [phone] by Durward Fortes, RN Outcome: Progressing 03/21/2020 0834 by Durward Fortes, RN Outcome: Progressing   Problem: Safety: Goal: Ability to remain free from injury will improve 03/21/2020 1120 by Durward Fortes, RN Outcome: Adequate for Discharge 03/21/2020 0838 by Durward Fortes, RN Outcome: Progressing 03/21/2020 0834 by Durward Fortes, RN Outcome: Progressing   Problem: Skin Integrity: Goal: Risk for impaired skin  integrity will decrease 03/21/2020 1120 by Durward Fortes, RN Outcome: Adequate for Discharge 03/21/2020 0838 by Durward Fortes, RN Outcome: Progressing 03/21/2020 0834 by Durward Fortes, RN  Outcome: Progressing   

## 2020-03-21 NOTE — Plan of Care (Signed)

## 2020-03-21 NOTE — Discharge Summary (Signed)
Discharge Diagnoses:  Active Problems:   Unilateral primary osteoarthritis, left knee   Arthritis of left knee   Surgeries: Procedure(s): LEFT TOTAL KNEE ARTHROPLASTY on 03/19/2020    Consultants:   Discharged Condition: Improved  Hospital Course: Nicholas Gregory is an 59 y.o. male who was admitted 03/19/2020 with a chief complaint of left knee pain, with a final diagnosis of Osteoarthritis Left Knee.  Patient was brought to the operating room on 03/19/2020 and underwent Procedure(s): LEFT TOTAL KNEE ARTHROPLASTY.    Patient was given perioperative antibiotics:  Anti-infectives (From admission, onward)   Start     Dose/Rate Route Frequency Ordered Stop   03/19/20 1600  ceFAZolin (ANCEF) IVPB 2g/100 mL premix        2 g 200 mL/hr over 30 Minutes Intravenous Every 6 hours 03/19/20 1332 03/20/20 0015   03/19/20 0645  ceFAZolin (ANCEF) IVPB 2g/100 mL premix        2 g 200 mL/hr over 30 Minutes Intravenous On call to O.R. 03/19/20 0631 03/19/20 0858   03/19/20 0635  ceFAZolin (ANCEF) 2-4 GM/100ML-% IVPB       Note to Pharmacy: Lorenda Ishihara   : cabinet override      03/19/20 8242 03/19/20 0843    .  Patient was given sequential compression devices, early ambulation, and aspirin for DVT prophylaxis.  Recent vital signs:  Patient Vitals for the past 24 hrs:  BP Temp Temp src Pulse Resp SpO2  03/21/20 0306 (!) 142/99 99.8 F (37.7 C) Oral 83 18 96 %  03/20/20 2002 (!) 143/85 99 F (37.2 C) Oral 75 18 97 %  03/20/20 1535 (!) 155/86 98.7 F (37.1 C) Oral 92 17 96 %  03/20/20 0801 (!) 148/86 98.5 F (36.9 C) Oral 72 17 96 %  .  Recent laboratory studies: No results found.  Discharge Medications:   Allergies as of 03/21/2020   No Known Allergies     Medication List    TAKE these medications   amLODipine 10 MG tablet Commonly known as: NORVASC Take 10 mg by mouth daily.   aspirin 325 MG EC tablet Take 1 tablet (325 mg total) by mouth daily with breakfast.    meloxicam 15 MG tablet Commonly known as: MOBIC Take 1 tablet (15 mg total) by mouth daily.   oxyCODONE-acetaminophen 5-325 MG tablet Commonly known as: Percocet Take 1 tablet by mouth every 4 (four) hours as needed.            Durable Medical Equipment  (From admission, onward)         Start     Ordered   03/20/20 1103  For home use only DME 3 n 1  Once        03/20/20 1103   03/20/20 1102  For home use only DME Walker rolling  Once       Question Answer Comment  Walker: With 5 Inch Wheels   Patient needs a walker to treat with the following condition Hx of total knee arthroplasty, left      03/20/20 1103          Diagnostic Studies: No results found.  Patient benefited maximally from their hospital stay and there were no complications.     Disposition: Discharge disposition: 01-Home or Self Care      Discharge Instructions    Call MD / Call 911   Complete by: As directed    If you experience chest pain or shortness of breath, CALL 911 and  be transported to the hospital emergency room.  If you develope a fever above 101 F, pus (white drainage) or increased drainage or redness at the wound, or calf pain, call your surgeon's office.   Constipation Prevention   Complete by: As directed    Drink plenty of fluids.  Prune juice may be helpful.  You may use a stool softener, such as Colace (over the counter) 100 mg twice a day.  Use MiraLax (over the counter) for constipation as needed.   Diet - low sodium heart healthy   Complete by: As directed    Do not put a pillow under the knee. Place it under the heel.   Complete by: As directed    Increase activity slowly as tolerated   Complete by: As directed       Follow-up Information    Bricelyn Freestone, [place] In 1 week.   Specialty: Orthopedic Surgery Contact information: [address] [place] [address] [phone]        [facility] Follow up.   Specialty: Home Health Services Why: [facility]  Home will be providing you with home health services.  They will call you in the next 24-48 hours to set up therapy times once you are discharged home. Contact information: [address] [place] [address] [phone]                Signed: [place] Riham Polyakov 03/21/2020, 7:20 AM

## 2020-03-26 ENCOUNTER — Other Ambulatory Visit: Payer: Self-pay

## 2020-03-26 ENCOUNTER — Ambulatory Visit (INDEPENDENT_AMBULATORY_CARE_PROVIDER_SITE_OTHER): Payer: BC Managed Care – PPO | Admitting: Physician Assistant

## 2020-03-26 ENCOUNTER — Encounter: Payer: Self-pay | Admitting: Physician Assistant

## 2020-03-26 DIAGNOSIS — M1712 Unilateral primary osteoarthritis, left knee: Secondary | ICD-10-CM

## 2020-03-26 NOTE — Progress Notes (Signed)
Office Visit Note   Patient: Nicholas Gregory           Date of Birth: 30-Jul-1960           MRN: [id number] Visit Date: 03/26/2020              Requested by: Harlow Mares, MD [address] [place],  [place] [address] PCP: Harlow Mares, MD  Chief Complaint  Patient presents with  . Left Knee - Routine Post Op    03/19/20 left total knee replacement       HPI: This is a 59 year old gentleman who is 1 week status post left knee replacement.  He is ambulating with crutches.  He has had 1 session of home physical therapy and has another today.  He is unsure if he wants to do outpatient physical therapy  Assessment & Plan: Visit Diagnoses:  1. Unilateral primary osteoarthritis, left knee     Plan: Have recommended outpatient physical therapy will place an order.  Continue to use the ice machine and elevate leg may cleanse with Dial soap and water.  Follow-up in 1 week for suture removal he should get x-rays of his knee at that visit  Follow-Up Instructions: No follow-ups on file.   Ortho Exam  Patient is alert, oriented, no adenopathy, well-dressed, normal affect, normal respiratory effort. Left knee well approximated wound edges no necrosis moderate soft tissue swelling but no cellulitis.  He cannot engage his quadricep yet with a straight leg raise.  He does not quite come to full extension.  Has flexion to 95 degrees.  Compartments are soft and nontender negative Homans' sign no cellulitis or evidence of infection  Imaging: No results found. No images are attached to the encounter.  Labs: No results found for: HGBA1C, ESRSEDRATE, CRP, LABURIC, REPTSTATUS, GRAMSTAIN, CULT, LABORGA   No results found for: ALBUMIN, PREALBUMIN, LABURIC  No results found for: MG No results found for: VD25OH  No results found for: PREALBUMIN CBC EXTENDED Latest Ref Rng & Units 03/13/2020  WBC 4.0 - 10.5 K/uL 6.9  RBC 4.22 - 5.81 MIL/uL 5.47  HGB 13.0 - 17.0 g/dL  62.9  HCT 52.8 - 41.3 % 45.8  PLT 150 - 400 K/uL 222     There is no height or weight on file to calculate BMI.  Orders:  Orders Placed This Encounter  Procedures  . Ambulatory referral to Physical Therapy   No orders of the defined types were placed in this encounter.    Procedures: No procedures performed  Clinical Data: No additional findings.  ROS:  All other systems negative, except as noted in the HPI. Review of Systems  Objective: Vital Signs: There were no vitals taken for this visit.  Specialty Comments:  No specialty comments available.  PMFS History: Patient Active Problem List   Diagnosis Date Noted  . Arthritis of left knee 03/19/2020  . Unilateral primary osteoarthritis, left knee    Past Medical History:  Diagnosis Date  . Arthritis   . Hypertension     No family history on file.  Past Surgical History:  Procedure Laterality Date  . KNEE ARTHROPLASTY Right    x2  . KNEE ARTHROSCOPY Left   . TOTAL KNEE ARTHROPLASTY Left 03/19/2020   Procedure: LEFT TOTAL KNEE ARTHROPLASTY;  Surgeon: Nadara Mustard, MD;  Location: [facility] OR;  Service: Orthopedics;  Laterality: Left;   Social History   Occupational History  . Not on file  Tobacco Use  .  Smoking status: Never Smoker  . Smokeless tobacco: Never Used  Vaping Use  . Vaping Use: Never used  Substance and Sexual Activity  . Alcohol use: Never  . Drug use: Never  . Sexual activity: Not on file

## 2020-04-03 ENCOUNTER — Ambulatory Visit: Payer: BC Managed Care – PPO | Admitting: Rehabilitative and Restorative Service Providers"

## 2020-04-08 ENCOUNTER — Encounter: Payer: Self-pay | Admitting: Physician Assistant

## 2020-04-08 ENCOUNTER — Ambulatory Visit (INDEPENDENT_AMBULATORY_CARE_PROVIDER_SITE_OTHER): Payer: BLUE CROSS/BLUE SHIELD | Admitting: Physician Assistant

## 2020-04-08 ENCOUNTER — Ambulatory Visit: Payer: Self-pay

## 2020-04-08 DIAGNOSIS — Z96652 Presence of left artificial knee joint: Secondary | ICD-10-CM

## 2020-04-08 NOTE — Progress Notes (Signed)
Office Visit Note   Patient: Nicholas Gregory           Date of Birth: 1960/12/20           MRN: [id number] Visit Date: 04/08/2020              Requested by: Harlow Mares, MD [address] [place],  [place] [address] PCP: Harlow Mares, MD  Chief Complaint  Patient presents with  . Left Knee - Routine Post Op      HPI: This is a pleasant 60 year old gentleman who is 2-1/2-week status post left total knee arthroplasty.  Overall he is doing well and has been working on self-guided therapy though he is gradual to begin physical therapy on Monday.  He states his only concern is he is having trouble getting to full extension on his knee.  He is also noticed some medial thigh pain.  This seems to feel better when he massages it a little bit or is in the shower and allows warm water to run over it  Assessment & Plan: Visit Diagnoses:  1. Status post left knee replacement     Plan: Patient will begin physical therapy.  I think the pain he is having his medial thigh the way he traces it is along the vastus medialis muscle.  Follow-up in 4 weeks  Follow-Up Instructions: No follow-ups on file.   Ortho Exam  Patient is alert, oriented, no adenopathy, well-dressed, normal affect, normal respiratory effort. Well-healed surgical site incision swelling is well controlled he has no effusion no erythema no signs of infection.  Wound is healed and surgical staples were removed without difficulty today.  He has flexion 200 degrees but is lacking about 7 or 8 degrees of full extension.  His thigh muscle is soft cannot palpate any cords no cellulitis or erythema.  Tenderness seems to be focused in the vastus medialis calves are soft and nontender negative Homans' sign  Imaging: No results found. No images are attached to the encounter.  Labs: No results found for: HGBA1C, ESRSEDRATE, CRP, LABURIC, REPTSTATUS, GRAMSTAIN, CULT, LABORGA   No results found for: ALBUMIN,  PREALBUMIN, LABURIC  No results found for: MG No results found for: VD25OH  No results found for: PREALBUMIN CBC EXTENDED Latest Ref Rng & Units 03/13/2020  WBC 4.0 - 10.5 K/uL 6.9  RBC 4.22 - 5.81 MIL/uL 5.47  HGB 13.0 - 17.0 g/dL 23.5  HCT 57.3 - 22.0 % 45.8  PLT 150 - 400 K/uL 222     There is no height or weight on file to calculate BMI.  Orders:  Orders Placed This Encounter  Procedures  . XR Knee 1-2 Views Left   No orders of the defined types were placed in this encounter.    Procedures: No procedures performed  Clinical Data: No additional findings.  ROS:  All other systems negative, except as noted in the HPI. Review of Systems  Objective: Vital Signs: There were no vitals taken for this visit.  Specialty Comments:  No specialty comments available.  PMFS History: Patient Active Problem List   Diagnosis Date Noted  . Arthritis of left knee 03/19/2020  . Unilateral primary osteoarthritis, left knee    Past Medical History:  Diagnosis Date  . Arthritis   . Hypertension     No family history on file.  Past Surgical History:  Procedure Laterality Date  . KNEE ARTHROPLASTY Right    x2  . KNEE ARTHROSCOPY Left   .  TOTAL KNEE ARTHROPLASTY Left 03/19/2020   Procedure: LEFT TOTAL KNEE ARTHROPLASTY;  Surgeon: Nadara Mustard, MD;  Location: [facility] OR;  Service: Orthopedics;  Laterality: Left;   Social History   Occupational History  . Not on file  Tobacco Use  . Smoking status: Never Smoker  . Smokeless tobacco: Never Used  Vaping Use  . Vaping Use: Never used  Substance and Sexual Activity  . Alcohol use: Never  . Drug use: Never  . Sexual activity: Not on file

## 2020-04-12 ENCOUNTER — Other Ambulatory Visit: Payer: Self-pay | Admitting: Physician Assistant

## 2020-04-14 ENCOUNTER — Encounter: Payer: Self-pay | Admitting: Physical Therapy

## 2020-04-14 ENCOUNTER — Ambulatory Visit (INDEPENDENT_AMBULATORY_CARE_PROVIDER_SITE_OTHER): Payer: BC Managed Care – PPO | Admitting: Physical Therapy

## 2020-04-14 ENCOUNTER — Other Ambulatory Visit: Payer: Self-pay

## 2020-04-14 DIAGNOSIS — R2689 Other abnormalities of gait and mobility: Secondary | ICD-10-CM

## 2020-04-14 DIAGNOSIS — R600 Localized edema: Secondary | ICD-10-CM

## 2020-04-14 DIAGNOSIS — M25662 Stiffness of left knee, not elsewhere classified: Secondary | ICD-10-CM

## 2020-04-14 DIAGNOSIS — M25562 Pain in left knee: Secondary | ICD-10-CM | POA: Diagnosis not present

## 2020-04-14 DIAGNOSIS — M6281 Muscle weakness (generalized): Secondary | ICD-10-CM | POA: Diagnosis not present

## 2020-04-14 NOTE — Therapy (Addendum)
[facility] [address] [place], [place], [address] Phone: [phone]   Fax:  [phone]  Physical Therapy Evaluation/Discharge  Patient Details  Name: Nicholas Gregory MRN: [id number] Date of Birth: Nov 12, 1960 Referring Provider (PT): Dallie Dad Date: 04/14/2020   PT End of Session - 04/14/20 1334    Visit Number 1    Number of Visits 12    Date for PT Re-Evaluation 05/26/20    PT Start Time 1105    PT Stop Time 1145    PT Time Calculation (min) 40 min    Activity Tolerance Patient tolerated treatment well    Behavior During Therapy [facility] for tasks assessed/performed           Past Medical History:  Diagnosis Date  . Arthritis   . Hypertension     Past Surgical History:  Procedure Laterality Date  . KNEE ARTHROPLASTY Right    x2  . KNEE ARTHROSCOPY Left   . TOTAL KNEE ARTHROPLASTY Left 03/19/2020   Procedure: LEFT TOTAL KNEE ARTHROPLASTY;  Surgeon: Newt Minion, MD;  Location: [facility];  Service: Orthopedics;  Laterality: Left;    There were no vitals filed for this visit.    Subjective Assessment - 04/14/20 1313    Subjective He relays Lt TKA 03/19/20 and he has also had 2 TKAs on his Rt side. 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    Pertinent History Lt TKA 03/19/20    Currently in Pain? Yes    Pain Score 5     Pain Location Knee    Pain Orientation Left;Anterior    Pain Descriptors / Indicators Aching    Pain Type Surgical pain    Pain Radiating Towards denies N/T    Pain Onset 1 to 4 weeks ago    Pain Frequency Intermittent    Aggravating Factors  pronlonged standing or stretching    Pain Relieving Factors rest, ice    Multiple Pain Sites No              [facility] PT Assessment - 04/14/20 0001      Assessment   Medical Diagnosis Lt TKA 03/19/20    Referring Provider (PT) Sharol Given    Next MD Visit nothing scheduled       Balance Screen   Has the patient fallen in the past 6 months No    Has the patient had a decrease in activity level because of a fear of falling?  No    Is the patient reluctant to leave their home because of a fear of falling?  No      Home Environment   Living Environment Private residence    Additional Comments steps at his house but relays no dificulty with these      Prior Function   Level of Independence Independent    Vocation Full time employment    Dispensing optician      Cognition   Overall Cognitive Status Within Functional Limits for tasks assessed      Observation/Other Assessments   Observations 3 small open spots on incision that he has dressed with paper towel and ace WRAP, this was changed out today for band aide and sterile guaze    Focus on Therapeutic Outcomes (FOTO)  not done, may be one time visit only      ROM / Strength   AROM /  PROM / Strength AROM;PROM;Strength      AROM   AROM Assessment Site Knee    Right/Left Knee Left    Left Knee Extension 10    Left Knee Flexion 100      PROM   PROM Assessment Site Knee    Right/Left Knee Left    Left Knee Extension 8    Left Knee Flexion 104      Strength   Overall Strength Comments Lt hip/knee overall 4+      Ambulation/Gait   Gait Comments ambulates without AD with good balance, but lacks TKE with gait and less stance time on Lt                      Objective measurements completed on examination: See above findings.       [place] [facility] PT Treatment/Exercise - 04/14/20 0001      Exercises   Exercises Knee/Hip      Knee/Hip Exercises: Stretches   Active Hamstring Stretch Left;3 reps;30 seconds    Other Knee/Hip Stretches supine TKE stretch with heel prop 30 sec X 3      Knee/Hip Exercises: Standing   Other Standing Knee Exercises TKE 5 sec hold X 10 reps      Knee/Hip Exercises: Seated   Other Seated Knee/Hip Exercises seated SLR X 10 reps      Knee/Hip  Exercises: Supine   Quad Sets Left;10 reps    Straight Leg Raises Left;10 reps                       PT Long Term Goals - 04/14/20 1340      PT LONG TERM GOAL #1   Title Pt will verbalize understanding of HEP and was provided copy    Baseline met today    Status Achieved      PT LONG TERM GOAL #2   Title more goals to be written if he returns to PT                  Plan - 04/14/20 1336    Clinical Impression Statement Pt presents with Lt TKA 03/19/20. He will benefit from skilled PT to address his deficits in strength, ROM, and standing tolerance. POC written for 6 weeks but he is unsure how often he can attend PT so he was given extensive HEP to do and will F/U PRN.    Examination-Activity Limitations Lift;Stand;Stairs;Squat    Examination-Participation Restrictions Driving;Occupation;Cleaning    Stability/Clinical Decision Making Stable/Uncomplicated    Clinical Decision Making Low    Rehab Potential Good    PT Frequency 2x / week    PT Duration 6 weeks    PT Treatment/Interventions ADLs/Self Care Home Management;Cryotherapy;Electrical Stimulation;Iontophoresis 69m/ml Dexamethasone;Moist Heat;Ultrasound;Gait training;Therapeutic activities;Therapeutic exercise;Neuromuscular re-education;Balance training;Manual techniques;Dry needling;Joint Manipulations    PT Next Visit Plan strength and extension ROM    PT Home Exercise Plan heel prop for TKE stretching, HSS, SLR seated, mini squats    Consulted and Agree with Plan of Care Patient           Patient will benefit from skilled therapeutic intervention in order to improve the following deficits and impairments:  Abnormal gait,Decreased activity tolerance,Decreased balance,Decreased strength,Difficulty walking,Decreased range of motion,Impaired flexibility,Pain  Visit Diagnosis: Acute pain of left knee  Stiffness of left knee, not elsewhere classified  Muscle weakness (generalized)  Other abnormalities  of gait and mobility  Localized edema     Problem  List Patient Active Problem List   Diagnosis Date Noted  . Arthritis of left knee 03/19/2020  . Unilateral primary osteoarthritis, left knee     Silvestre Mesi 04/14/2020, 1:42 PM   PHYSICAL THERAPY DISCHARGE SUMMARY  Visits from Start of Care:1  Current functional level related to goals / functional outcomes: See note   Remaining deficits: See note   Education / Equipment: HEP Plan: Patient agrees to discharge.  Patient goals were not met. Patient is being discharged due to not returning since the last visit.  ?????     Scot Jun, PT, DPT, OCS, ATC 08/19/20  2:30 PM     [facility] [address] [place], [place], [address] Phone: [phone]   Fax:  [phone]  Name: OTONIEL MYHAND MRN: [id number] Date of Birth: 1961-02-15

## 2020-04-29 ENCOUNTER — Encounter: Payer: Self-pay | Admitting: Physician Assistant

## 2020-04-29 ENCOUNTER — Ambulatory Visit (INDEPENDENT_AMBULATORY_CARE_PROVIDER_SITE_OTHER): Payer: BLUE CROSS/BLUE SHIELD | Admitting: Physician Assistant

## 2020-04-29 DIAGNOSIS — Z96652 Presence of left artificial knee joint: Secondary | ICD-10-CM

## 2020-04-29 MED ORDER — DOXYCYCLINE HYCLATE 100 MG PO TABS: 100.0000 mg | ORAL_TABLET | Freq: Two times a day (BID) | ORAL | 0 refills | Status: AC

## 2020-04-29 NOTE — Progress Notes (Signed)
Office Visit Note   Patient: Nicholas Gregory           Date of Birth: 06/28/1960           MRN: [id number] Visit Date: 04/29/2020              Requested by: Harlow Mares, MD [address] [place],  [place] [address] PCP: Harlow Mares, MD  Chief Complaint  Patient presents with  . Left Knee - Routine Post Op    03/19/20 left total knee replacement       HPI: This is a pleasant 60 year old gentleman who is 6 weeks status post left total knee arthroplasty.  He called our triage line stating he pulled a scab at the distal end of his incision and there was a piece of suture.  He did try to tug on the suture, flexed his knee and the suture retracted into the wound.  He denies any increase in pain or swelling and is otherwise doing well Assessment & Plan: Visit Diagnoses: No diagnosis found.  Plan: We will place the patient on doxycycline for 10 days.  Reevaluate in 2 weeks.  Sooner if he has any concerns  Follow-Up Instructions: No follow-ups on file.   Ortho Exam  Patient is alert, oriented, no adenopathy, well-dressed, normal affect, normal respiratory effort. Examination of his knee no cellulitis no effusion swelling is actually quite well controlled he has flexion and extension without pain.  At the very distal end of his incision he has a small scab.  On close examination there was a piece of suture matted down in the scab.  I did take sterile pickups and scissors and gently pulled on this it did release with a knot present.  It estimated the same length of suture he noted in the picture he showed me.  Imaging: No results found. No images are attached to the encounter.  Labs: No results found for: HGBA1C, ESRSEDRATE, CRP, LABURIC, REPTSTATUS, GRAMSTAIN, CULT, LABORGA   No results found for: ALBUMIN, PREALBUMIN, LABURIC  No results found for: MG No results found for: VD25OH  No results found for: PREALBUMIN CBC EXTENDED Latest Ref Rng & Units  03/13/2020  WBC 4.0 - 10.5 K/uL 6.9  RBC 4.22 - 5.81 MIL/uL 5.47  HGB 13.0 - 17.0 g/dL 78.2  HCT 95.6 - 21.3 % 45.8  PLT 150 - 400 K/uL 222     There is no height or weight on file to calculate BMI.  Orders:  No orders of the defined types were placed in this encounter.  Meds ordered this encounter  Medications  . doxycycline (VIBRA-TABS) 100 MG tablet    Sig: Take 1 tablet (100 mg total) by mouth 2 (two) times daily.    Dispense:  60 tablet    Refill:  0     Procedures: No procedures performed  Clinical Data: No additional findings.  ROS:  All other systems negative, except as noted in the HPI. Review of Systems  Objective: Vital Signs: There were no vitals taken for this visit.  Specialty Comments:  No specialty comments available.  PMFS History: Patient Active Problem List   Diagnosis Date Noted  . Arthritis of left knee 03/19/2020  . Unilateral primary osteoarthritis, left knee    Past Medical History:  Diagnosis Date  . Arthritis   . Hypertension     No family history on file.  Past Surgical History:  Procedure Laterality Date  . KNEE ARTHROPLASTY Right  x2  . KNEE ARTHROSCOPY Left   . TOTAL KNEE ARTHROPLASTY Left 03/19/2020   Procedure: LEFT TOTAL KNEE ARTHROPLASTY;  Surgeon: Nadara Mustard, MD;  Location: [facility] OR;  Service: Orthopedics;  Laterality: Left;   Social History   Occupational History  . Not on file  Tobacco Use  . Smoking status: Never Smoker  . Smokeless tobacco: Never Used  Vaping Use  . Vaping Use: Never used  Substance and Sexual Activity  . Alcohol use: Never  . Drug use: Never  . Sexual activity: Not on file

## 2020-05-20 ENCOUNTER — Other Ambulatory Visit: Payer: Self-pay | Admitting: Physician Assistant

## 2020-07-08 ENCOUNTER — Ambulatory Visit (INDEPENDENT_AMBULATORY_CARE_PROVIDER_SITE_OTHER): Payer: BC Managed Care – PPO

## 2020-07-08 ENCOUNTER — Ambulatory Visit (INDEPENDENT_AMBULATORY_CARE_PROVIDER_SITE_OTHER): Payer: BC Managed Care – PPO | Admitting: Physician Assistant

## 2020-07-08 ENCOUNTER — Ambulatory Visit: Payer: Self-pay

## 2020-07-08 ENCOUNTER — Encounter: Payer: Self-pay | Admitting: Physician Assistant

## 2020-07-08 DIAGNOSIS — M25561 Pain in right knee: Secondary | ICD-10-CM

## 2020-07-08 DIAGNOSIS — G8929 Other chronic pain: Secondary | ICD-10-CM | POA: Diagnosis not present

## 2020-07-08 DIAGNOSIS — M25562 Pain in left knee: Secondary | ICD-10-CM

## 2020-07-08 MED ORDER — MELOXICAM 15 MG PO TABS: 15.0000 mg | ORAL_TABLET | Freq: Every day | ORAL | 2 refills | Status: AC

## 2020-07-08 NOTE — Progress Notes (Signed)
Office Visit Note   Patient: Nicholas Gregory           Date of Birth: 04/29/1960           MRN: [id number] Visit Date: 07/08/2020              Requested by: Harlow Mares, MD [address] [place],  [place] [address] PCP: Harlow Mares, MD  Chief Complaint  Patient presents with  . Left Knee - Routine Post Op, Follow-up    03/19/20 left total knee   . Right Knee - Pain      HPI: Patient is a pleasant active 60 year old gentleman who is 4 months status post left total knee arthroplasty.  He is also status post right total knee arthroplasty revision.  He was last seen a few months ago and was lost to follow-up.  He comes in today because he is complaining of bilateral pain along the medial patellar border of both of his knees.  He denies any fever or chills or any increase in his swelling.  He is currently not doing any physical therapy because he finds it painful.  He finds it especially difficult when he has sat for a while and goes to arise.  Also going downstairs.  Assessment & Plan: Visit Diagnoses:  1. Chronic pain of both knees     Plan: We will start patient on meloxicam 15 mg daily.  He should not take this with other anti-inflammatories.  Also begin a focused quadricep close chain strengthening.  Follow-up for reevaluation in 2 weeks  Follow-Up Instructions: No follow-ups on file.   Ortho Exam  Patient is alert, oriented, no adenopathy, well-dressed, normal affect, normal respiratory effort. Bilateral knees no effusion no redness no cellulitis range of motion not particularly painful.  On both on the left and right knee he has tender along the medial patellar border.  No tenderness over the joint line or tibia or femur.  Imaging: XR Knee 1-2 Views Left  Result Date: 07/08/2020 2 views of his left knee demonstrates left total knee arthroplasty well-maintained alignment without any signs of loosening.  XR Knee 1-2 Views Right  Result Date:  07/08/2020 X-rays of his right knee demonstrate revision total knee arthroplasty.  Overall components in good position no changes since previous x-rays.  No images are attached to the encounter.  Labs: No results found for: HGBA1C, ESRSEDRATE, CRP, LABURIC, REPTSTATUS, GRAMSTAIN, CULT, LABORGA   No results found for: ALBUMIN, PREALBUMIN, CBC  No results found for: MG No results found for: VD25OH  No results found for: PREALBUMIN CBC EXTENDED Latest Ref Rng & Units 03/13/2020  WBC 4.0 - 10.5 K/uL 6.9  RBC 4.22 - 5.81 MIL/uL 5.47  HGB 13.0 - 17.0 g/dL 49.6  HCT 75.9 - 16.3 % 45.8  PLT 150 - 400 K/uL 222     There is no height or weight on file to calculate BMI.  Orders:  Orders Placed This Encounter  Procedures  . XR Knee 1-2 Views Right  . XR Knee 1-2 Views Left   No orders of the defined types were placed in this encounter.    Procedures: No procedures performed  Clinical Data: No additional findings.  ROS:  All other systems negative, except as noted in the HPI. Review of Systems  Objective: Vital Signs: There were no vitals taken for this visit.  Specialty Comments:  No specialty comments available.  PMFS History: Patient Active Problem List   Diagnosis Date Noted  .  Arthritis of left knee 03/19/2020  . Unilateral primary osteoarthritis, left knee    Past Medical History:  Diagnosis Date  . Arthritis   . Hypertension     No family history on file.  Past Surgical History:  Procedure Laterality Date  . KNEE ARTHROPLASTY Right    x2  . KNEE ARTHROSCOPY Left   . TOTAL KNEE ARTHROPLASTY Left 03/19/2020   Procedure: LEFT TOTAL KNEE ARTHROPLASTY;  Surgeon: Nadara Mustard, MD;  Location: [facility] OR;  Service: Orthopedics;  Laterality: Left;   Social History   Occupational History  . Not on file  Tobacco Use  . Smoking status: Never Smoker  . Smokeless tobacco: Never Used  Vaping Use  . Vaping Use: Never used  Substance and Sexual Activity  . Alcohol  use: Never  . Drug use: Never  . Sexual activity: Not on file

## 2020-07-08 NOTE — Addendum Note (Signed)
Addended by: Polly Cobia on: 07/08/2020 10:50 AM   Modules accepted: Orders

## 2020-09-08 ENCOUNTER — Ambulatory Visit (INDEPENDENT_AMBULATORY_CARE_PROVIDER_SITE_OTHER): Payer: BC Managed Care – PPO | Admitting: Orthopedic Surgery

## 2020-09-08 ENCOUNTER — Other Ambulatory Visit: Payer: Self-pay

## 2020-09-08 DIAGNOSIS — G8929 Other chronic pain: Secondary | ICD-10-CM | POA: Diagnosis not present

## 2020-09-08 DIAGNOSIS — M25562 Pain in left knee: Secondary | ICD-10-CM

## 2020-09-08 DIAGNOSIS — M25561 Pain in right knee: Secondary | ICD-10-CM | POA: Diagnosis not present

## 2020-09-08 DIAGNOSIS — Z96653 Presence of artificial knee joint, bilateral: Secondary | ICD-10-CM

## 2020-09-11 ENCOUNTER — Telehealth: Payer: Self-pay

## 2020-09-11 ENCOUNTER — Telehealth: Payer: Self-pay | Admitting: Orthopedic Surgery

## 2020-09-11 NOTE — Telephone Encounter (Signed)
Patient would to have a referral put in to have a MRI of his knees and his shoulders?  Stated that is is still having a lot of pain in his knees. CB# [phone].  Please advise.  Thank you

## 2020-09-12 NOTE — Telephone Encounter (Signed)
Pt was in the office this week and dictation is pending. Dr. Lajoyce Corners is in surgery today so I will hold to address on Monday.

## 2020-09-15 ENCOUNTER — Encounter: Payer: Self-pay | Admitting: Orthopedic Surgery

## 2020-09-15 NOTE — Progress Notes (Signed)
Office Visit Note   Patient: Nicholas Gregory           Date of Birth: 05-14-1960           MRN: [id number] Visit Date: 09/08/2020              Requested by: Harlow Mares, MD [address] [place],  [place] [address] PCP: Harlow Mares, MD  Chief Complaint  Patient presents with   Left Knee - Follow-up    Hx total knee 03/19/2020   Right Knee - Follow-up      HPI: Patient is a 60 year old gentleman who presents complaining of bilateral knee pain.  He is status post a left total knee arthroplasty March 19, 2020 and a right revision knee arthroplasty in 2019 he is taking Mobic 15 mg a day without relief.  He complains of pain over the medial aspect of the patella bilaterally.  He states he has decreased range of motion pain going up and down stairs.  I called the back today Monday due to his increased pain worse in the right knee.  Patient states that he is back on crutches that his right knee pain is excruciating.  Patient denies any acute trauma.  Patient states that he was told by the surgeon that did his revision knee arthroplasty on the right that he would need a third knee surgery on the right.  Assessment & Plan: Visit Diagnoses:  1. Chronic pain of both knees   2. Status post bilateral knee replacements     Plan: We will set the patient up for quad strengthening.  Reevaluate in 4 weeks.  Patient may need additional studies of both knees.  After talking with the patient today we will order a CT scan of both knees to evaluate for any loosening around the implants.  We will follow-up after the CT scans are obtained.  Follow-Up Instructions: Return in about 4 weeks (around 10/06/2020).   Ortho Exam  Patient is alert, oriented, no adenopathy, well-dressed, normal affect, normal respiratory effort. Examination patient has a normal gait but feels like his knees want to collapse into valgus.  He lacks 5 degrees to full extension of both knees has  flexion to 120 degrees bilaterally he does have start up stiffness and is pain to palpation of the medial aspect of the patella bilaterally.  Review of the radiographs shows stable total knee arthroplasties without lucency around the implants.  There is lateral tracking of the patella bilaterally.  Imaging: No results found. No images are attached to the encounter.  Labs: No results found for: HGBA1C, ESRSEDRATE, CRP, LABURIC, REPTSTATUS, GRAMSTAIN, CULT, LABORGA   No results found for: ALBUMIN, PREALBUMIN, CBC  No results found for: MG No results found for: VD25OH  No results found for: PREALBUMIN CBC EXTENDED Latest Ref Rng & Units 03/13/2020  WBC 4.0 - 10.5 K/uL 6.9  RBC 4.22 - 5.81 MIL/uL 5.47  HGB 13.0 - 17.0 g/dL 97.3  HCT 53.2 - 99.2 % 45.8  PLT 150 - 400 K/uL 222     There is no height or weight on file to calculate BMI.  Orders:  No orders of the defined types were placed in this encounter.  No orders of the defined types were placed in this encounter.    Procedures: No procedures performed  Clinical Data: No additional findings.  ROS:  All other systems negative, except as noted in the HPI. Review of Systems  Objective: Vital Signs: There were  no vitals taken for this visit.  Specialty Comments:  No specialty comments available.  PMFS History: Patient Active Problem List   Diagnosis Date Noted   Arthritis of left knee 03/19/2020   Unilateral primary osteoarthritis, left knee    Past Medical History:  Diagnosis Date   Arthritis    Hypertension     History reviewed. No pertinent family history.  Past Surgical History:  Procedure Laterality Date   KNEE ARTHROPLASTY Right    x2   KNEE ARTHROSCOPY Left    TOTAL KNEE ARTHROPLASTY Left 03/19/2020   Procedure: LEFT TOTAL KNEE ARTHROPLASTY;  Surgeon: Nadara Mustard, MD;  Location: [facility] OR;  Service: Orthopedics;  Laterality: Left;   Social History   Occupational History   Not on file  Tobacco  Use   Smoking status: Never   Smokeless tobacco: Never  Vaping Use   Vaping Use: Never used  Substance and Sexual Activity   Alcohol use: Never   Drug use: Never   Sexual activity: Not on file

## 2020-09-22 ENCOUNTER — Ambulatory Visit
Admission: RE | Admit: 2020-09-22 | Discharge: 2020-09-22 | Disposition: A | Discharge status: Home / Self Care | Payer: BC Managed Care – PPO | Source: Ambulatory Visit | Attending: Orthopedic Surgery | Admitting: Orthopedic Surgery

## 2020-09-22 DIAGNOSIS — G8929 Other chronic pain: Secondary | ICD-10-CM

## 2020-09-22 DIAGNOSIS — Z96653 Presence of artificial knee joint, bilateral: Secondary | ICD-10-CM

## 2020-09-22 DIAGNOSIS — M25562 Pain in left knee: Secondary | ICD-10-CM

## 2020-09-26 ENCOUNTER — Encounter: Payer: Self-pay | Admitting: Orthopedic Surgery

## 2020-09-26 NOTE — Progress Notes (Unsigned)
I spoke with patient last night regarding the CT scan findings of both knees.  We reviewed the left knee which showed a stable total knee arthroplasty.  Patient was concerned that he was having left knee pain.  Discussed that we could reevaluate his symptoms but it may be due to overloading from favoring the right knee and could be due to muscle weakness, he had no effusion.  We reviewed the CT scan of the right knee which showed loosening of his stemmed, revision total knee arthroplasty.  Patient states that the previous surgeon that revised his total knee stated that with loosening he would have to undergo a revision.  We reviewed revision surgery of his stemmed total knee arthroplasty.  Patient states he is extremely concerned regarding a additional surgical procedure on the right knee.  He states that he needs to think about this.  I encouraged that he follow-up in the office so we could reevaluate both knees.

## 2020-10-27 ENCOUNTER — Telehealth: Payer: Self-pay | Admitting: Orthopedic Surgery

## 2020-10-27 NOTE — Telephone Encounter (Signed)
Received call from pt wanting his records and xrays send to a Dr. Jacquelyne Balint. I advised pt need to complete and sign authorization form. He asked for it to be emailed to him. I emailed auth form to him. He aware to contact [facility] for the CT scans.pts ph [phone], email [email or web address]

## 2021-04-22 ENCOUNTER — Encounter: Payer: Self-pay | Admitting: Family

## 2021-04-22 ENCOUNTER — Ambulatory Visit (INDEPENDENT_AMBULATORY_CARE_PROVIDER_SITE_OTHER): Payer: BC Managed Care – PPO | Admitting: Family

## 2021-04-22 ENCOUNTER — Other Ambulatory Visit: Payer: Self-pay

## 2021-04-22 DIAGNOSIS — M25561 Pain in right knee: Secondary | ICD-10-CM

## 2021-04-22 DIAGNOSIS — M25562 Pain in left knee: Secondary | ICD-10-CM

## 2021-04-22 DIAGNOSIS — S76102A Unspecified injury of left quadriceps muscle, fascia and tendon, initial encounter: Secondary | ICD-10-CM

## 2021-04-22 DIAGNOSIS — G8929 Other chronic pain: Secondary | ICD-10-CM

## 2021-04-22 MED ORDER — METHYLPREDNISOLONE 4 MG PO TBPK: ORAL_TABLET | ORAL | 0 refills | Status: AC

## 2021-04-22 NOTE — Progress Notes (Unsigned)
° °  Office Visit Note   Patient: Nicholas Gregory           Date of Birth: 14-Mar-1961           MRN: [id number] Visit Date: 04/22/2021              Requested by: Harlow Mares, MD [address] [place],  [place] [address] PCP: Harlow Mares, MD  Chief Complaint  Patient presents with   Left Knee - Follow-up    Hx total knee 03/19/2020   Right Knee - Follow-up    S/p revision total knee 2019      HPI: ***  Assessment & Plan: Visit Diagnoses:  1. Chronic pain of both knees   2. Unspecified injury of left quadriceps muscle, fascia and tendon, initial encounter     Plan: ***  Follow-Up Instructions: Return in about 6 weeks (around 06/03/2021), or if symptoms worsen or fail to improve.   Ortho Exam  Patient is alert, oriented, no adenopathy, well-dressed, normal affect, normal respiratory effort. ***  Imaging: No results found. No images are attached to the encounter.  Labs: No results found for: HGBA1C, ESRSEDRATE, CRP, LABURIC, REPTSTATUS, GRAMSTAIN, CULT, LABORGA   No results found for: ALBUMIN, PREALBUMIN, CBC  No results found for: MG No results found for: VD25OH  No results found for: PREALBUMIN CBC EXTENDED Latest Ref Rng & Units 03/13/2020  WBC 4.0 - 10.5 K/uL 6.9  RBC 4.22 - 5.81 MIL/uL 5.47  HGB 13.0 - 17.0 g/dL 34.1  HCT 96.2 - 22.9 % 45.8  PLT 150 - 400 K/uL 222     There is no height or weight on file to calculate BMI.  Orders:  Orders Placed This Encounter  Procedures   Ambulatory referral to Physical Therapy   Meds ordered this encounter  Medications   methylPREDNISolone (MEDROL DOSEPAK) 4 MG TBPK tablet    Sig: Take per packet instructions    Dispense:  1 each    Refill:  0     Procedures: No procedures performed  Clinical Data: No additional findings.  ROS:  All other systems negative, except as noted in the HPI. Review of Systems  Objective: Vital Signs: There were no vitals taken for this  visit.  Specialty Comments:  No specialty comments available.  PMFS History: Patient Active Problem List   Diagnosis Date Noted   Arthritis of left knee 03/19/2020   Unilateral primary osteoarthritis, left knee    Past Medical History:  Diagnosis Date   Arthritis    Hypertension     History reviewed. No pertinent family history.  Past Surgical History:  Procedure Laterality Date   KNEE ARTHROPLASTY Right    x2   KNEE ARTHROSCOPY Left    TOTAL KNEE ARTHROPLASTY Left 03/19/2020   Procedure: LEFT TOTAL KNEE ARTHROPLASTY;  Surgeon: Nadara Mustard, MD;  Location: [facility] OR;  Service: Orthopedics;  Laterality: Left;   Social History   Occupational History   Not on file  Tobacco Use   Smoking status: Never   Smokeless tobacco: Never  Vaping Use   Vaping Use: Never used  Substance and Sexual Activity   Alcohol use: Never   Drug use: Never   Sexual activity: Not on file

## 2021-04-30 NOTE — Therapy (Incomplete)
OUTPATIENT PHYSICAL THERAPY LOWER EXTREMITY EVALUATION   Patient Name: Nicholas Gregory MRN: [id number] DOB:04/24/1960, 61 y.o., male Today's Date: 05/04/2021    Past Medical History:  Diagnosis Date   Arthritis    Hypertension    Past Surgical History:  Procedure Laterality Date   KNEE ARTHROPLASTY Right    x2   KNEE ARTHROSCOPY Left    TOTAL KNEE ARTHROPLASTY Left 03/19/2020   Procedure: LEFT TOTAL KNEE ARTHROPLASTY;  Surgeon: Nadara Mustard, MD;  Location: [facility] OR;  Service: Orthopedics;  Laterality: Left;   Patient Active Problem List   Diagnosis Date Noted   Arthritis of left knee 03/19/2020   Unilateral primary osteoarthritis, left knee     PCP: Harlow Mares, MD  REFERRING PROVIDER: Adonis Huguenin, NP  REFERRING DIAG: M25.561,M25.562,G89.29 (ICD-10-CM) - Chronic pain of both knees S76.102A (ICD-10-CM) - Unspecified injury of left quadriceps muscle, fascia and tendon, initial encounter  THERAPY DIAG:  No diagnosis found.  ONSET DATE: 03/19/2020 (Lt TKA)  SUBJECTIVE:   SUBJECTIVE STATEMENT: ***  PERTINENT HISTORY: Referral for Eval and treat Lt knee. Quad tendonopathy, rom, strengthening History of Lt TKA 03/19/20 - seen 1 visit 04/14/2020  PAIN:  Are you having pain? Yes NPRS scale: ***/10 Pain location: Lt knee Pain orientation: Left  PAIN TYPE: chronic  Pain description: {PAIN DESCRIPTION:21022940}  Aggravating factors: *** Relieving factors: ***  PRECAUTIONS: None  WEIGHT BEARING RESTRICTIONS No  FALLS:  Has patient fallen in last 6 months? No, Number of falls: 0   LIVING ENVIRONMENT: Lives with: {[facility] lives with:25569::"lives with their family"} Lives in: {Lives in:25570} Stairs: {yes/no:20286}; {Stairs:24000} Has following equipment at home: {Assistive devices:23999}  OCCUPATION: ***  PLOF: Independent  PATIENT GOALS Reduce pain   OBJECTIVE:   DIAGNOSTIC FINDINGS: ***  PATIENT SURVEYS:  FOTO 05/04/2021 eval:   initial:     predicted:   COGNITION:  Overall cognitive status: Within functional limits for tasks assessed     SENSATION:  Light touch: Appears intact    MUSCLE LENGTH: Hamstrings: Right *** deg; Left *** deg Thomas test: Right *** deg; Left *** deg  POSTURE:  ***  PALPATION: ***  LE AROM/PROM:  A/PROM Right 05/04/2021 Left 05/04/2021  Hip flexion    Hip extension    Hip abduction    Hip adduction    Hip internal rotation    Hip external rotation    Knee flexion    Knee extension    Ankle dorsiflexion    Ankle plantarflexion    Ankle inversion    Ankle eversion     (Blank rows = not tested)  LE MMT:  MMT (Dynamometry in lbs) Right 05/04/2021 Left 05/04/2021  Hip flexion    Hip extension    Hip abduction    Hip adduction    Hip internal rotation    Hip external rotation    Knee flexion    Knee extension    Ankle dorsiflexion    Ankle plantarflexion    Ankle inversion    Ankle eversion     (Blank rows = not tested)  LOWER EXTREMITY SPECIAL TESTS:  {LEspecialtests:26242}  FUNCTIONAL TESTS:  {Functional tests:24029}  GAIT:  Assistive device utilized: None Level of assistance: Complete Independence Comments: ***    TODAY'S TREATMENT: 05/04/2021 eval: Therex:    PATIENT EDUCATION:  Education details: HEP, POC Person educated: Patient Education method: Programmer, multimedia, Facilities manager, Verbal cues, and Handouts Education comprehension: verbalized understanding and returned demonstration   HOME EXERCISE PROGRAM: ***  ASSESSMENT:  CLINICAL IMPRESSION: Patient is a 61 y.o. who comes to clinic with complaints of chronic Lt knee pain as chief complaint (has complaints on Rt as well) with mobility, strength and movement coordination deficits that impair their ability to perform usual daily and recreational functional activities without increase difficulty/symptoms at this time.  Patient to benefit from skilled PT services to address impairments and  limitations to improve to previous level of function without restriction secondary to condition. Objective impairments include Abnormal gait, decreased activity tolerance, decreased balance, decreased coordination, decreased endurance, decreased mobility, difficulty walking, decreased ROM, decreased strength, hypomobility, increased edema, impaired perceived functional ability, impaired flexibility, improper body mechanics, and pain. These impairments are limiting patient from community activity, occupation, yard work, and shopping. Personal factors including Time since onset of injury/illness/exacerbation are also affecting patient's functional outcome. Patient will benefit from skilled PT to address above impairments and improve overall function.  REHAB POTENTIAL: Good  CLINICAL DECISION MAKING: Stable/uncomplicated  EVALUATION COMPLEXITY: Low   GOALS: Goals reviewed with patient? Yes  SHORT TERM GOALS:  STG Name Target Date Goal status  1 Patient will demonstrate independent use of home exercise program to maintain progress from in clinic treatments.  {follow up:25551} {GOALSTATUS:25110}                                 LONG TERM GOALS:   LTG Name Target Date Goal status  1 Patient will demonstrate/report pain at worst less than or equal to 2/10 to facilitate minimal limitation in daily activity secondary to pain symptoms.  07/13/2021 INITIAL  2 Patient will demonstrate independent use of home exercise program to facilitate ability to maintain/progress functional gains from skilled physical therapy services.  07/13/2021 INITIAL  3 Pt. will demonstrate FOTO outcome > or = __% to indicated reduced disability due to condition. 07/13/2021 INITIAL  4 Patient will demonstrate Lt knee AROM 0-110 degrees to facilitate ability to perform transfers, sitting, ambulation, stair navigation s restriction due to mobility. 07/13/2021 INITIAL  5 Patient will demonstrate Lt LE MMT 5/5 throughout to  facilitate ability to perform usual standing, walking, stairs at PLOF s limitation due to symptoms. 07/13/2021 INITIAL  6 *** Baseline: {follow up:25551} {GOALSTATUS:25110}  7 *** Baseline: {follow up:25551} {GOALSTATUS:25110}   PLAN: PT FREQUENCY: 1-2x/week  PT DURATION: 10 weeks  PLANNED INTERVENTIONS: Therapeutic exercises, Therapeutic activity, Neuro Muscular re-education, Balance training, Gait training, Patient/Family education, Joint mobilization, Stair training, DME instructions, Dry Needling, Electrical stimulation, Cryotherapy, Moist heat, Taping, Ultrasound, Ionotophoresis 4mg /ml Dexamethasone, and Manual therapy  PLAN FOR NEXT SESSION: review existing HEP   , PT 05/04/2021, 7:50 AM

## 2021-05-04 ENCOUNTER — Ambulatory Visit: Payer: BC Managed Care – PPO | Admitting: Rehabilitative and Restorative Service Providers"

## 2021-05-19 ENCOUNTER — Other Ambulatory Visit: Payer: Self-pay

## 2021-05-19 ENCOUNTER — Telehealth: Payer: Self-pay | Admitting: Radiology

## 2021-05-19 NOTE — Telephone Encounter (Signed)
Voicemail left on triage line from Melody with [facility] requesting faxed note of premedication dental needs for patient.  Please fax to [phone]  Phone [phone]

## 2021-05-19 NOTE — Telephone Encounter (Signed)
Faxed information to office. Dr. Sharol Given does not require pre med with [facility] prior to dental procedures.

## 2021-06-04 ENCOUNTER — Ambulatory Visit: Payer: BC Managed Care – PPO | Admitting: Orthopedic Surgery

## 2021-06-15 ENCOUNTER — Ambulatory Visit: Payer: BC Managed Care – PPO | Admitting: Orthopedic Surgery

## 2021-06-22 ENCOUNTER — Ambulatory Visit (INDEPENDENT_AMBULATORY_CARE_PROVIDER_SITE_OTHER): Payer: BC Managed Care – PPO | Admitting: Orthopedic Surgery

## 2021-06-22 DIAGNOSIS — Z96653 Presence of artificial knee joint, bilateral: Secondary | ICD-10-CM | POA: Diagnosis not present

## 2021-06-22 DIAGNOSIS — G8929 Other chronic pain: Secondary | ICD-10-CM

## 2021-06-22 DIAGNOSIS — M25561 Pain in right knee: Secondary | ICD-10-CM

## 2021-06-22 DIAGNOSIS — M25562 Pain in left knee: Secondary | ICD-10-CM

## 2021-06-28 ENCOUNTER — Encounter: Payer: Self-pay | Admitting: Orthopedic Surgery

## 2021-06-28 NOTE — Progress Notes (Signed)
? ?Office Visit Note ?  ?Patient: Nicholas Gregory           ?Date of Birth: 05-10-1960           ?MRN: [id number] ?Visit Date: 06/22/2021 ?             ?Requested by: Harlow Mares, MD ?[address] ?[place],  [place] [address] ?PCP: Harlow Mares, MD ? ?Chief Complaint  ?Patient presents with  ? Right Knee - Pain  ? Left Knee - Pain  ? ? ? ? 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  Patient states that he has chronic pain in both shoulders and his lumbar spine as well as bilateral knee pain right worse than left.  Patient has been to physical therapy.  Patient states he has difficulty getting to physical therapy. ? ?CT scan obtained in 2022 showed no loosening. ? ?Assessment & Plan: ?Visit Diagnoses: No diagnosis found. ? ?Plan: I have recommended strengthening exercises that he could do at home.  This was demonstrated.  Discussed the importance of strengthening. ? ?Follow-Up Instructions: No follow-ups on file.  ? ?Ortho Exam ? ?Patient is alert, oriented, no adenopathy, well-dressed, normal affect, normal respiratory effort. ?Examination radiographs shows stable alignment of both knees.  Range of motion of the left knee 0 to 110 degrees range of motion of the right knee 0 to 130 degrees.  Varus and valgus stresses stable anterior drawer is stable.  There is no signs of redness cellulitis or infection and there is no effusion. ? ?Imaging: ?No results found. ?No images are attached to the encounter. ? ?Labs: ?No results found for: HGBA1C, ESRSEDRATE, CRP, LABURIC, REPTSTATUS, GRAMSTAIN, CULT, LABORGA ? ? ?No results found for: ALBUMIN, PREALBUMIN, CBC ? ?No results found for: MG ?No results found for: VD25OH ? ?No results found for: PREALBUMIN ? ?  Latest Ref Rng & Units 03/13/2020  ?  1:35 PM  ?CBC EXTENDED  ?WBC 4.0 - 10.5 K/uL 6.9    ?RBC 4.22 - 5.81  MIL/uL 5.47    ?Hemoglobin 13.0 - 17.0 g/dL 98.9    ?HCT 39.0 - 52.0 % 45.8    ?Platelets 150 - 400 K/uL 222    ? ? ? ?There is no height or weight on file to calculate BMI. ? ?Orders:  ?No orders of the defined types were placed in this encounter. ? ?No orders of the defined types were placed in this encounter. ? ? ? Procedures: ?No procedures performed ? ?Clinical Data: ?No additional findings. ? ?ROS: ? ?All other systems negative, except as noted in the HPI. ?Review of Systems ? ?Objective: ?Vital Signs: There were no vitals taken for this visit. ? ?Specialty Comments:  ?No specialty comments available. ? ?PMFS History: ?Patient Active Problem List  ? Diagnosis Date Noted  ? Arthritis of left knee 03/19/2020  ? Unilateral primary osteoarthritis, left knee   ? ?Past Medical History:  ?Diagnosis Date  ? Arthritis   ? Hypertension   ?  ?No family history on file.  ?Past Surgical History:  ?Procedure Laterality Date  ? KNEE ARTHROPLASTY Right   ? x2  ? KNEE ARTHROSCOPY Left   ? TOTAL KNEE ARTHROPLASTY Left 03/19/2020  ? Procedure: LEFT TOTAL KNEE ARTHROPLASTY;  Surgeon: Nadara Mustard, MD;  Location: [facility] OR;  Service: Orthopedics;  Laterality: Left;  ? ?Social  History  ? ?Occupational History  ? Not on file  ?Tobacco Use  ? Smoking status: Never  ? Smokeless tobacco: Never  ?Vaping Use  ? Vaping Use: Never used  ?Substance and Sexual Activity  ? Alcohol use: Never  ? Drug use: Never  ? Sexual activity: Not on file  ? ? ? ? ? ?

## 2021-08-17 NOTE — Telephone Encounter (Signed)
err

## 2023-03-02 IMAGING — CT CT KNEE*L* W/O CM
3 of 4 series · 16 of 35 positions shown, 19 images · non-contrast
Comparison: Radiographs 07/08/2020

CLINICAL DATA: Left knee pain.

EXAM:
CT OF THE left KNEE WITHOUT CONTRAST
TECHNIQUE: Multidetector CT imaging of the left knee was performed according to
the standard protocol. Multiplanar CT image reconstructions were
also generated.

[Series 9: lfov lower extremity 2.00 br40 s3 soft · coronal · 0.33mm/px · 3 of 85 slices shown (1 of 2)]
[im 17/85  bone]
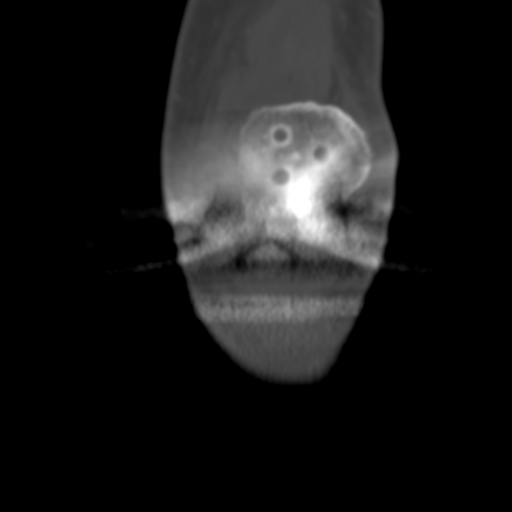
[im 34/85  bone]
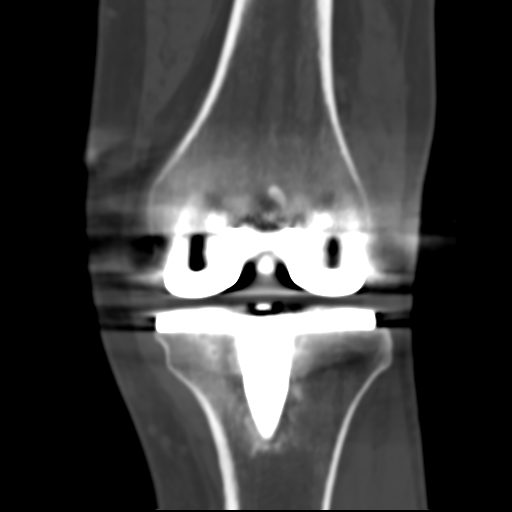
[im 51/85  bone]
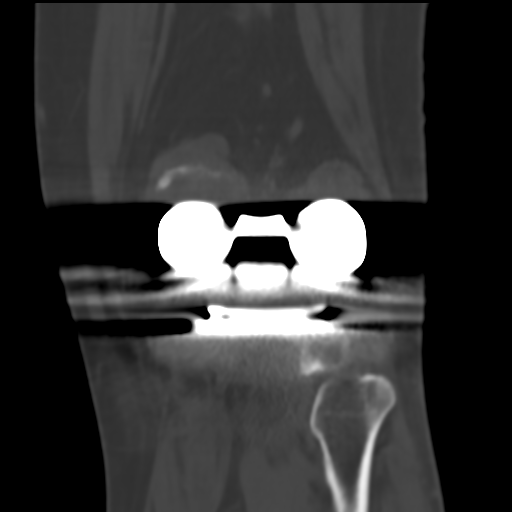

[Series 13: lfov lower extremity 2.00 br40 s3 soft · sagittal · 0.33mm/px · 5 of 86 slices shown, 6 images (2 of 2)]
[im 29/86  bone]
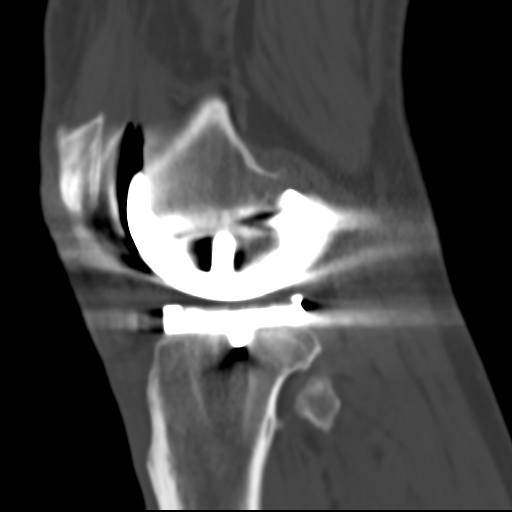
[im 36/86  bone]
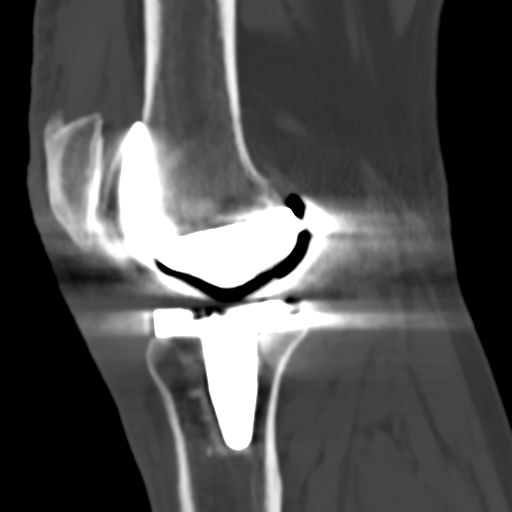
[im 43/86  soft-tissue]
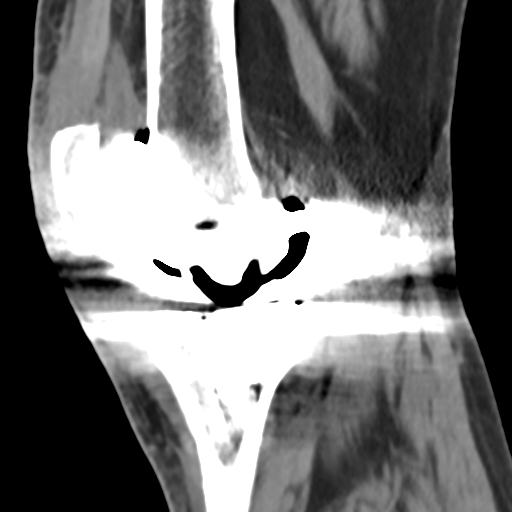
[im 43/86  bone]
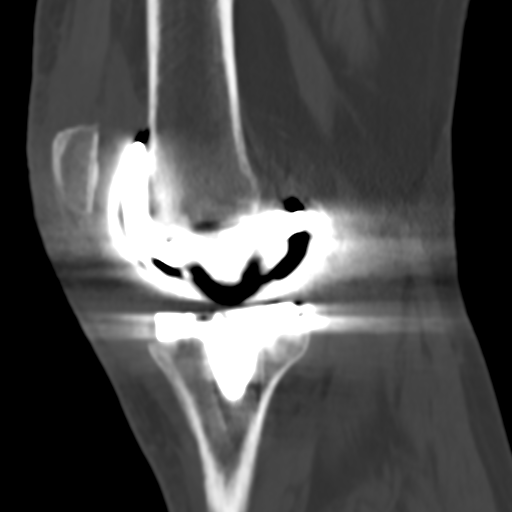
[im 50/86  bone]
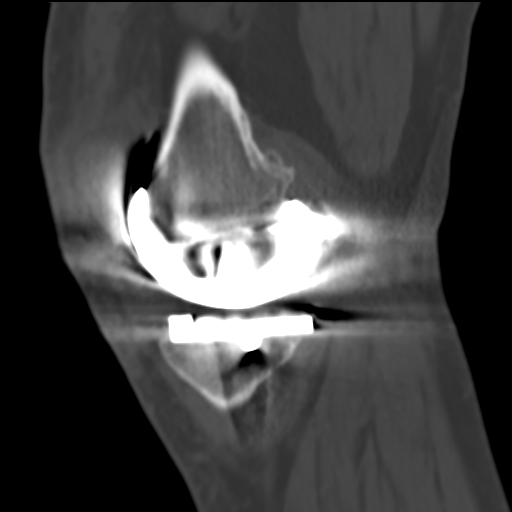
[im 57/86  bone]
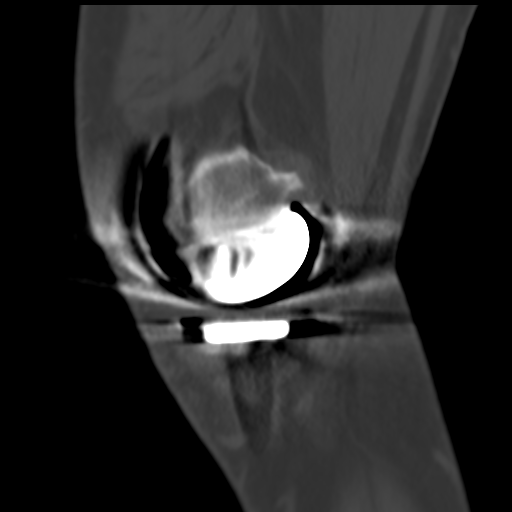

[Series 16: lfov lower extremity 0.60 br40 s3 soft thin (perso · axial · 0.33mm/px · z∈[+916,+1052]mm · 8 of 284 slices shown, 10 images]
[im 29/284  soft-tissue]
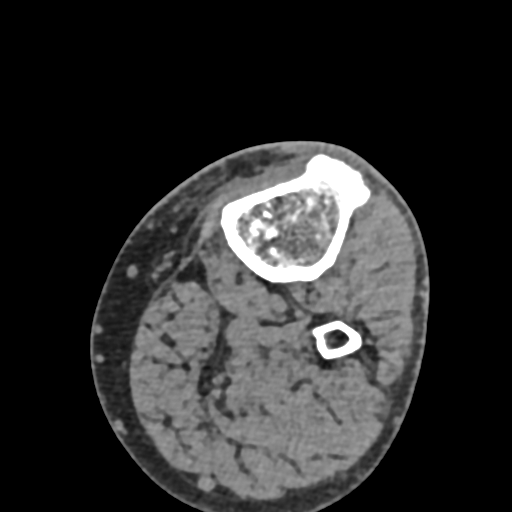
[im 29/284  bone]
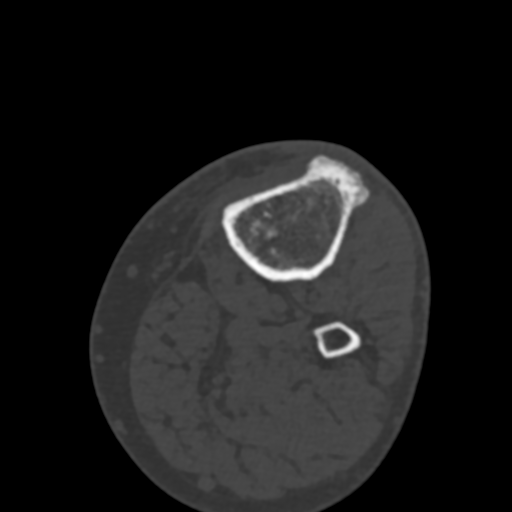
[im 57/284  bone]
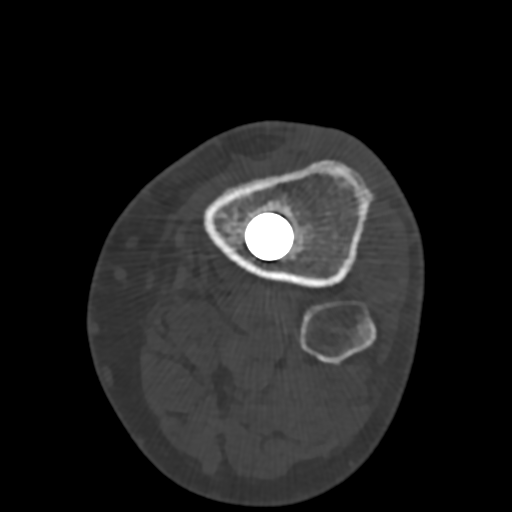
[im 85/284  bone]
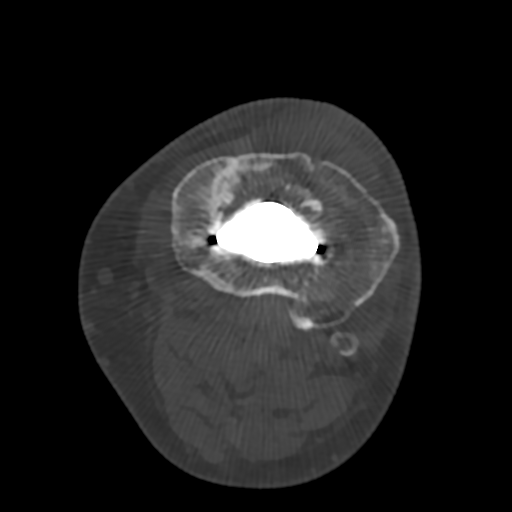
[im 114/284  bone]
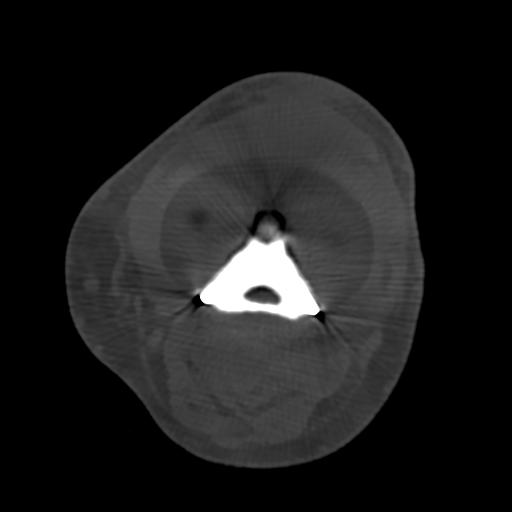
[im 170/284  soft-tissue]
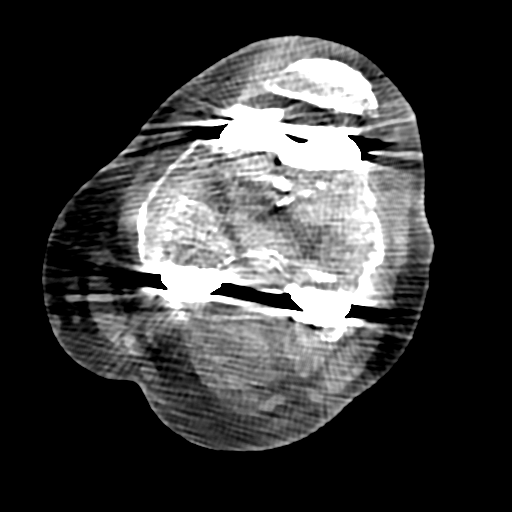
[im 170/284  bone]
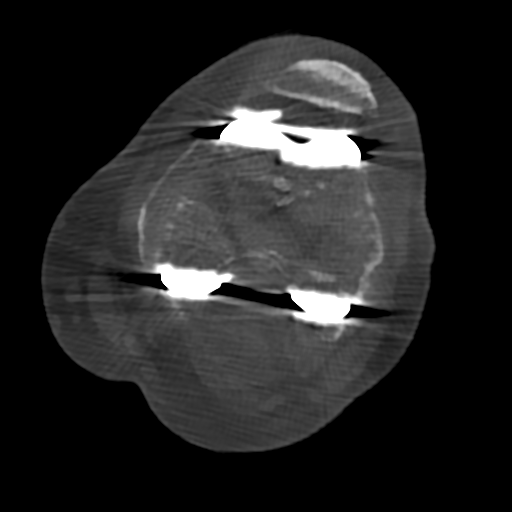
[im 199/284  bone]
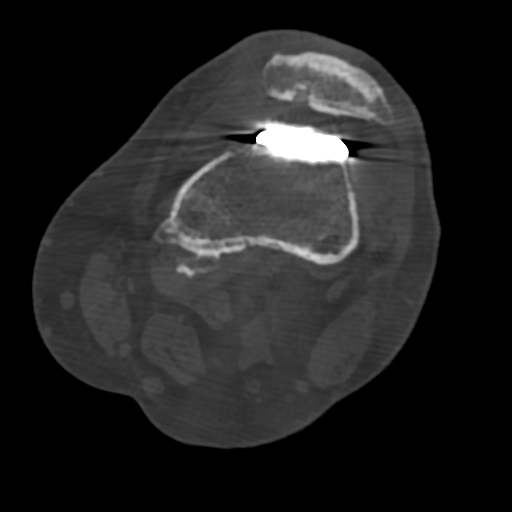
[im 227/284  bone]
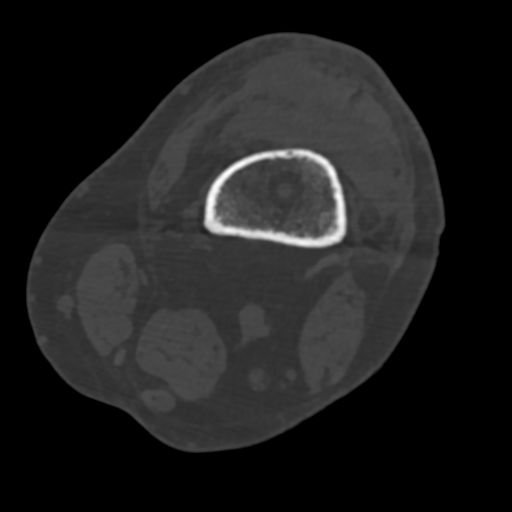
[im 255/284  bone]
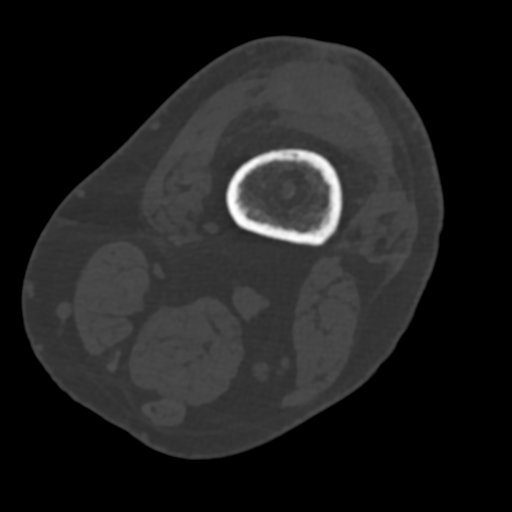

[16 of 35 positions shown; findings below may reference images not displayed]

FINDINGS: Short stem femoral component appears to be intact. I do not see any
obvious loosening by CT.

The tibial component appears intact. Very little bone cement but no
obvious lucency. No findings suspicious for periprosthetic fracture.

The quadriceps tendon appears moderately thickened. Possible chronic
tendinopathy. There is a small joint effusion. The patellar tendon
is not well demonstrated. The knee musculature is grossly normal.
IMPRESSION: 1. Arthroplasty components appear well seated without evidence of
loosening.
2. No findings suspicious for periprosthetic fracture.
3. Moderate thickening of the quadriceps tendon suggesting chronic
tendinopathy.
4. Small joint effusion.

## 2023-03-02 IMAGING — CT CT KNEE*R* W/O CM
3 of 4 series · 16 of 33 positions shown, 19 images · non-contrast
Comparison: Radiographs 07/08/2020

CLINICAL DATA: Right knee pain. History of total right knee
arthroplasty in 5721.

EXAM:
CT OF THE right KNEE WITHOUT CONTRAST
TECHNIQUE: Multidetector CT imaging of the right knee was performed according
to the standard protocol. Multiplanar CT image reconstructions were
also generated.

[Series 9: lfov lower extremity 2.00 br40 s3 soft · coronal · 0.43mm/px · 3 of 109 slices shown (1 of 2)]
[im 22/109  bone]
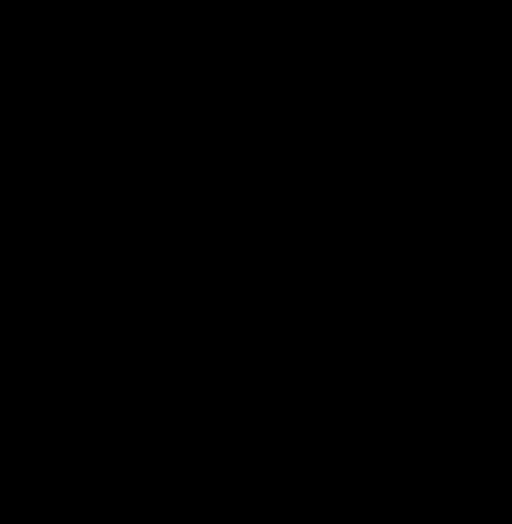
[im 44/109  bone]
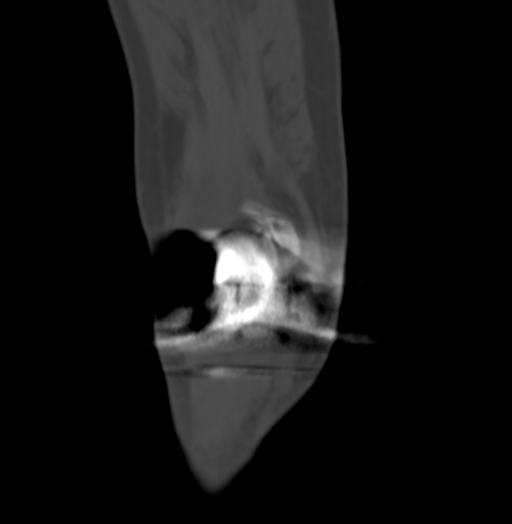
[im 65/109  bone]
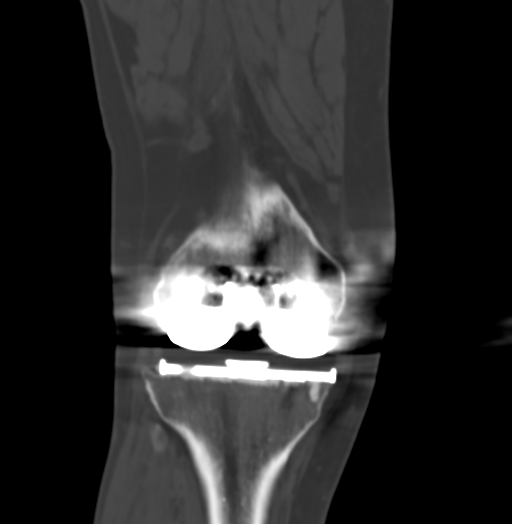

[Series 13: lfov lower extremity 2.00 br40 s3 soft · sagittal · 0.43mm/px · 5 of 109 slices shown, 6 images (2 of 2)]
[im 37/109  bone]
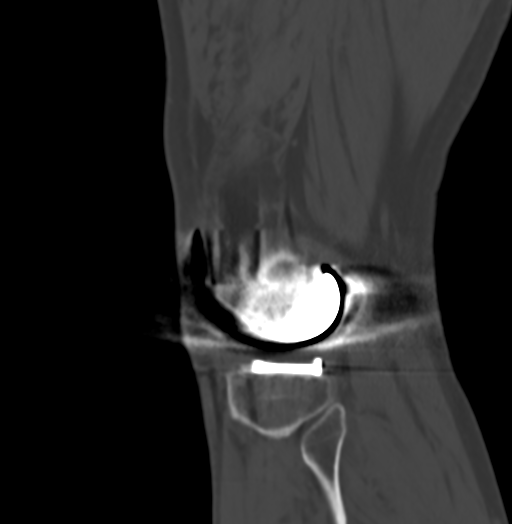
[im 46/109  bone]
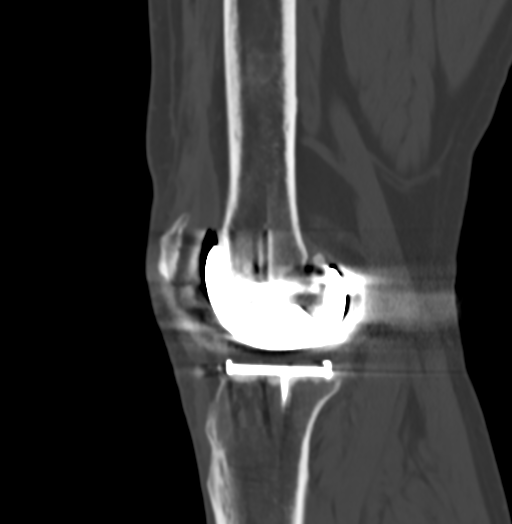
[im 55/109  soft-tissue]
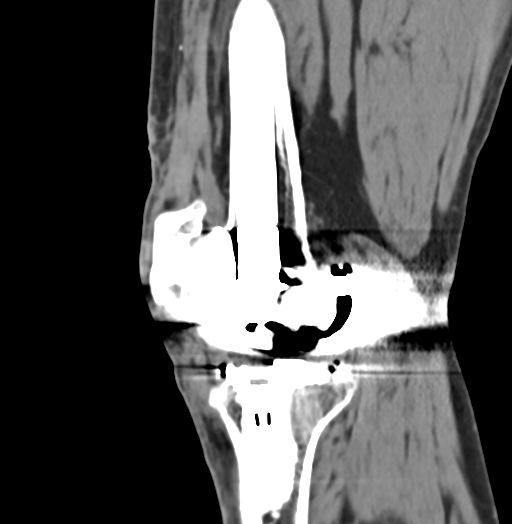
[im 55/109  bone]
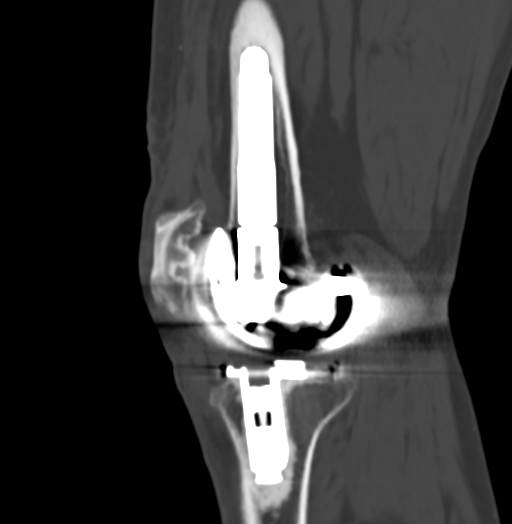
[im 64/109  bone]
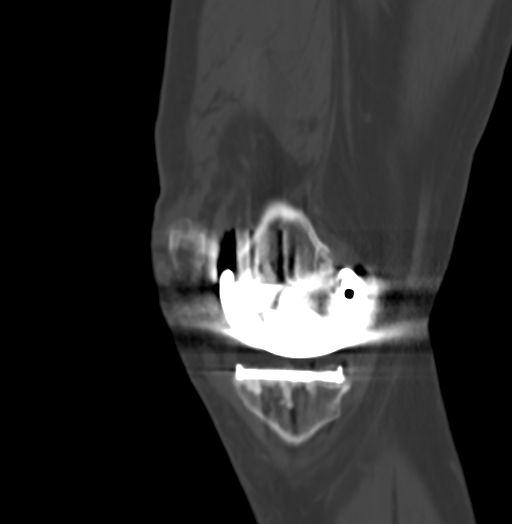
[im 73/109  bone]
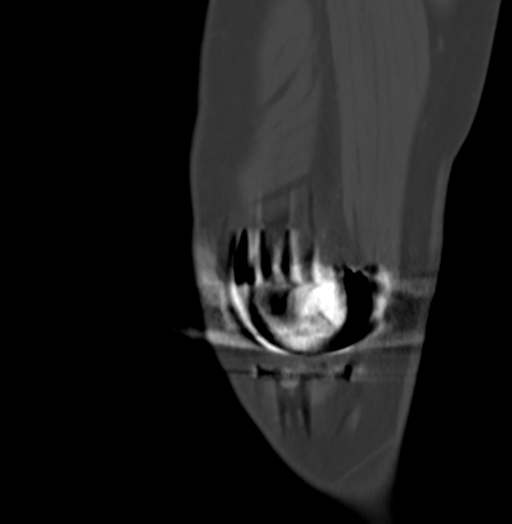

[Series 16: lfov lower extremity 0.60 br40 s3 soft thin (perso · axial · 0.43mm/px · z∈[+927,+1106]mm · 8 of 374 slices shown, 10 images]
[im 38/374  soft-tissue]
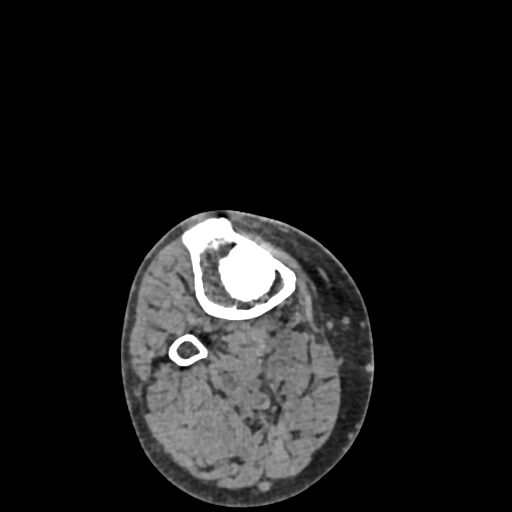
[im 38/374  bone]
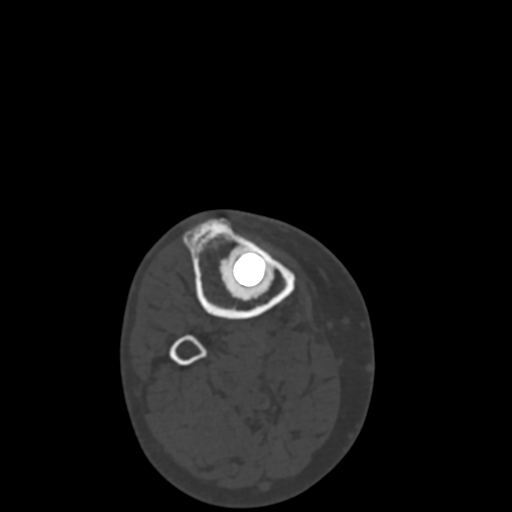
[im 75/374  bone]
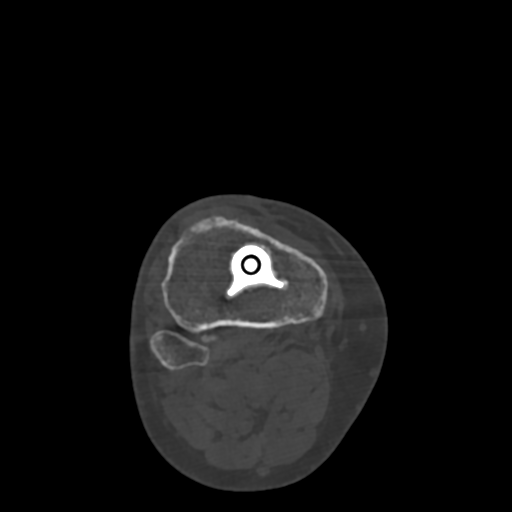
[im 112/374  bone]
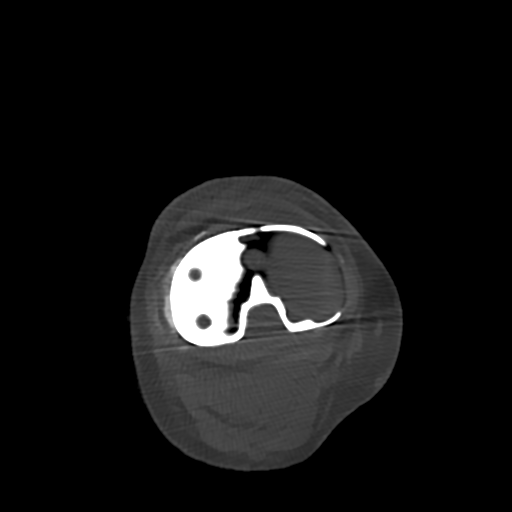
[im 150/374  bone]
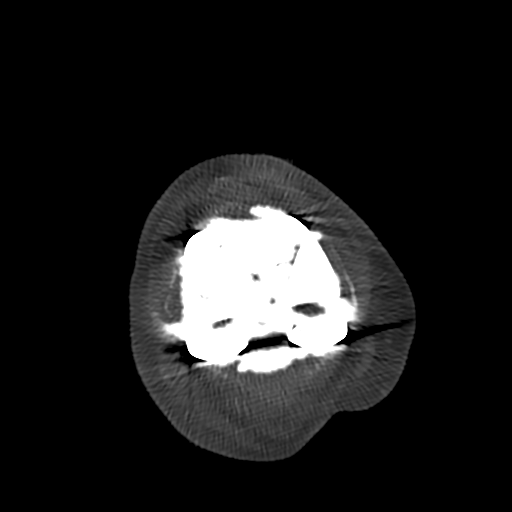
[im 224/374  soft-tissue]
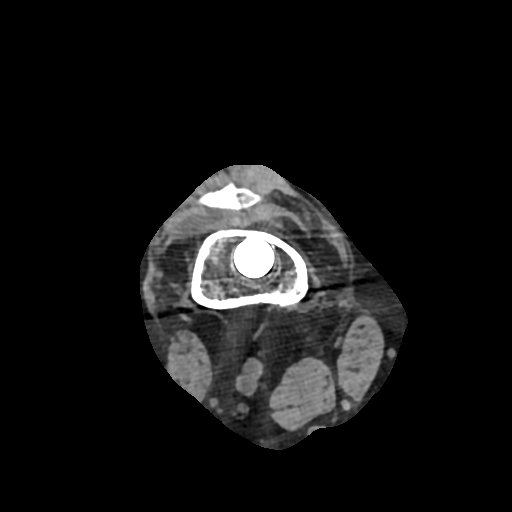
[im 224/374  bone]
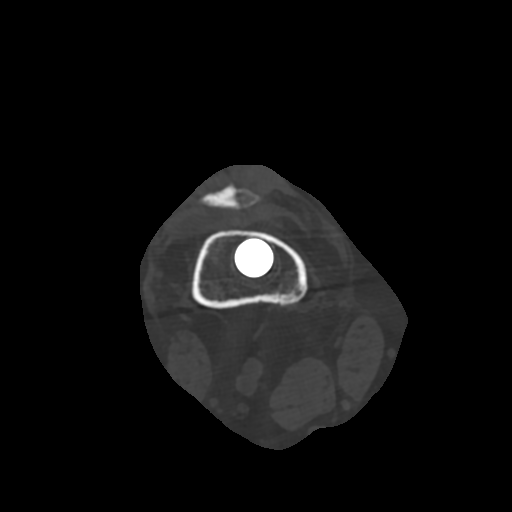
[im 262/374  bone]
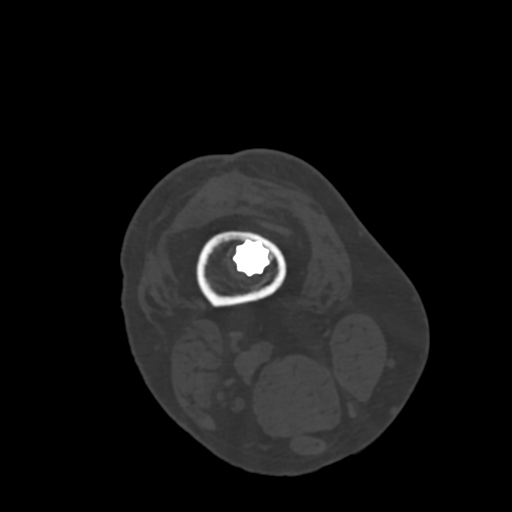
[im 299/374  bone]
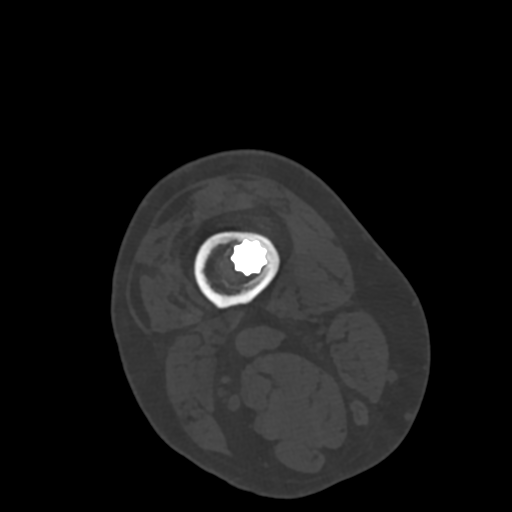
[im 336/374  bone]
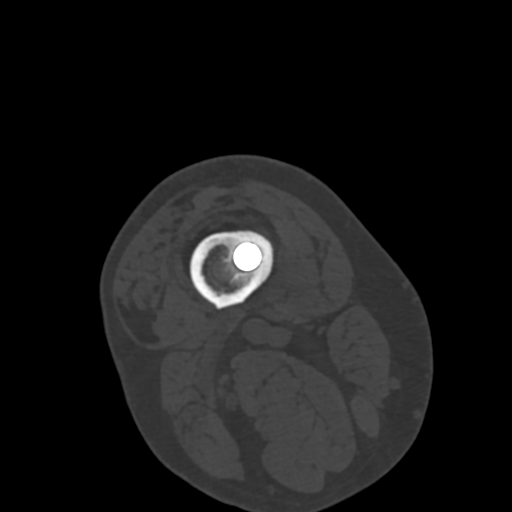

[16 of 33 positions shown; findings below may reference images not displayed]

FINDINGS: The femoral component is lose. There is marked lucency around the
femoral stem and the stem is canted medially and the knee appears to
be and valgus. The tip of the femoral stem is actually partially
eroded through the medial supracondylar cortex but there is
significant periosteal reaction and new bone formation buttressing
the medial displacement of the femoral component. The tibial
component appears intact. I do not see any definite lucency around
the prosthesis cement interface or cement bone interface.

There is a small knee joint effusion.
IMPRESSION: 1. Loose femoral prosthesis as detailed above.
2. The tip of the femoral stem is actually partially eroded through
the medial supracondylar cortex but there is significant periosteal
reaction and new bone formation buttressing the medial displacement
of the femoral component.
3. Small knee joint effusion.
